# Patient Record
Sex: Male | Born: 1960 | Race: White | Hispanic: No | Marital: Married
Health system: Midwestern US, Community
[De-identification: ages and names within clinical notes are randomized; demographics above are authoritative.]

## PROBLEM LIST (undated history)

## (undated) DIAGNOSIS — R972 Elevated prostate specific antigen [PSA]: Secondary | ICD-10-CM

## (undated) DIAGNOSIS — C61 Malignant neoplasm of prostate: Principal | ICD-10-CM

---

## 2013-11-05 LAB — TYPE AND SCREEN
Antibody Screen: NEGATIVE
Rh Type: POSITIVE

## 2013-11-05 LAB — PROTIME/INR & PTT
INR: 1.1 (ref 0.9–1.1)
Protime: 11.1 s (ref 9.0–12.0)
aPTT: 25.5 s (ref 20.0–30.5)

## 2013-11-10 ENCOUNTER — Ambulatory Visit: Admit: 2013-11-10 | Discharge: 2013-11-10 | Disposition: A | Discharge status: Home / Self Care

## 2013-11-11 NOTE — Op Note (Signed)
PATIENTNYQUAN, SELBE         SURGERY DATE:         11/09/2013  MEDICAL RECORD NUMBER:     [id number]            ADMISSION DATE:       11/09/2013  ACCOUNT #:           [id number]           ADMITTING:            Trey Sailors, MD  DATE OF BIRTH:       1960/10/12             SURGEON:              Trey Sailors, MD  AGE:                 53                     HOSPITAL SERVICE:                                  Electronically Authenticated                            Trey Sailors, MD 11/18/2013 02:04 P        Procedures:  1. ROBOTIC ASSISTED LAPAROSCOPIC RADICAL PROSTATECTOMY.  2. BILATERAL PELVIC LYMPH NODE DISSECTION.  3. NERVE-SPARING.    Preoperative Diagnosis:  Adenocarcinoma of the prostate.    Postoperative Diagnosis:  Adenocarcinoma of the prostate.    Anesthesia:  General anesthesia.    Complications:  None.    Specimen Removed:  Prostate seminal vesicles and bilateral pelvic  lymph nodes.    Estimated Blood Loss:  250 mL.    Fluids:  Crystalloid.    Drains:  None.    Foley:  20-French 20 mL catheter.    Medications:  Ancef 2 g IV piggyback.    Assistant:  1. Dr. Rory Percy.  2. Dr. Merlyn Albert.    Indications:  This is a 53 year old gentleman who was diagnosed with  prostatic adenocarcinoma.  He did have a long and thorough discussion  regarding his risks, benefits, and treatment alternatives as well as  side effects, complications, and expected postoperative course.  After  having these explained to him, he does wish to proceed forward with  robotic assisted laparoscopic radical prostatectomy.    Description of Procedure:  The patient was brought to the operating  room.  A thorough time-out was performed.  Everybody present was in  agreement.  He was placed on the operating table.  General anesthesia  was induced per Anesthesia.  All airways and lines were maintained per  Anesthesia throughout the main indicate.  He was placed in the dorsal  lithotomy position.  All pressure points were padded.   He was secured  to the table using foam and tape.  He was placed in a Trendelenburg  position.  Due to the patient's very large body habitus, anesthesia  was having some difficulty ventilating the patient in a steep  Trendelenburg position, so Trendelenburg was taken out of the  patient's position to a lesser angle to help with his ambulation.  A  Foley catheter was inserted sterilely on the field  after the patient  was prepped and draped in usual sterile fashion.  Access to the  abdominal cavity was obtained by a Veress needle through a 2 cm  incision just cranial to the umbilicus.  The abdomen was insufflated  to 20 mmHg for port placement, later decreased to 15 mmHg for the  working portion of the case.  The camera port was placed and the  abdomen was inspected and visualized to have no injury from Veress  needle placement.  Following this, the following trocars were placed  under direct visualization.  Two 8 mm robotic ports were placed in the  patient's left side of midline.  Additional 8 mm robotic port was  placed on the patient's right side of midline.  A 15 mm assistant port  was placed in the patient's right side as well as 5 mm assistant port  in the patient's right side.  After this, the robotic cart was docked.  The robotic portion of the case was begun.  There was several small  bowel and large bowel adhesions, which were taken down off the pelvic  sidewall.  Due to the left in Trendelenburg position, needed to  maintain adequate ventilation as a very large patient.  Small bowel  could not be pulled out of the pelvis, so was adequately retracted by  the surgical bedside assistant.  The bladder was dropped off the  anterior abdominal wall by dissecting lateral to the medial umbilical  ligament and space of Retzius.  This was cleared down through the  pubic bone to develop this space of Retzius and expose the prostate  and endopelvic fascia.  The superior dorsal vein was controlled using  bipolar cautery  and the prostate was cleared of its fat.  Following  this, the pelvic lymph node dissection was begun on the left side.  The internal and external iliac lymph node packets were dissected out  and cautery was used to control hemostasis and small lymphatic  vessels.  The internal iliac packet consisted of the tissue ring near  the course of the internal iliac vessels with the pelvic side wall of  the lateral border of the bladder and endopelvic fascia to the medial  border working deep in the crotch towards the anterior iliac vessels.  The external iliac pack was cleared out border by the pelvic sidewall  and the external iliac vein.  The obturator nerve was identified and  care was taken to preserve and not cause any injury to the obturator  nerve.  Circumflex branches of the external iliac were preserved  intact and hemostasis was excellent after the lymph node dissection in  a similar process was carried out on the patient's contralateral side.  At this point, the endopelvic fascia was divided bilaterally sweeping  any nerve tissue off the prostate at this point in time.  The bladder  neck was tented.  The catheter was found to identify the junction  between the bladder neck and the prostate.  An 1 cm incision was  carried out through the bladder neck to the prostatic lumen.  The  Foley catheter was deflated, grasped, and retracted to provide upward  traction of the prostate.  The bladder neck was dissected off of the  prostate in the usual fashion.  Care was taken not to enter into the  plane of the prostate during this dissection.  The posterior plane was  developed after dividing the posterior detrusor muscle between the  bladder and the prostate.  Ureteral orifices were identified and found  to be well back from the bladder neck.  The posterior dissection was  continued until the ampulla of vas deferens were encountered, starting  on the patient's left side.  The ampulla of vas deferens was  transected.  The  left seminal vesicle was dissected out using minimal  electrocautery.  Hem-O-Lok were used to control the arterial supply.  The left vas deferens and the ampulla of the vas was then used to  provide upward retraction with the fourth arm and similar dissection  was carried on the patient's right side.  After the seminal vesicles  and vas deferens had been dissected out, the assistant did provide  upward retraction on the patient's right seminal vesicle and ampulla  of the vas, and posterior plane was developed bluntly.  This  dissection care was taken to avoid rectal injury.  Next, the prostatic  pedicles were developed and transected between Hem -O-Lok clips  starting the patient's left side.  The early bulky pedicle tissue was  taken between Hem-O-Lok clips.  After this, aggressive left-sided  nerve sparing was obtained in a retrograde fashion by sweeping off all  the vascular nerve tissue off the capsule of the prostate working  towards the prostatic apex.  Once at the level of the prostatic apex,  the loose areolar tissue was swept away and divided.  Our attention  was focused to the patient's right side, very aggressive nerve-sparing  was obtained on the patient's left side and standard nerve sparing was  obtained on the patient's right side.  On the right side, the bulky  pedicle tissue was taken between Weck clips to the level of the  prostate and following this, remaining [place] fatty tissue was swept  away from the capsule of the prostate working again towards the  prostatic apex.  At this point, attention was turned to the apical  dissection.  Pneumoperitoneum was increased to 20 mmHg for this  portion of the case and using a 0 degree lens and a hook, the apex of  the prostate and dorsal venous complex were divided down to the level  of Foley catheter.  The dorsal venous complex was then oversewn to  achieve excellent hemostasis.  The urethra was divided 5 mm away from  the apex of the prostate.  The  prostate was then placed in an  EndoCatch bag after grossly observing it and finding a free of any  evidence of tumor extension.  At this point in time, the  reconstruction was obtained and begun using 3-0 Hem-O-Lok starting at  the 4 o'clock position of the bladder neck and bites from the outside  in on the bladder, working inside out on the urethra in a clockwise  fashion around to 11 o'clock was begun to completely reapproximate the  urethra and bladder neck in a watertight fashion.  Midway between the  3 anastomosis, the direction of the suture was reversed, so that the  knot would lie on the outside of the bladder.  Using a second 3-0  V-Loc, 180 CV23 needle now working from outside in on the urethra and  inside on the bladder, starting again at around 4 o'clock on the  bladder neck, now working in a counter-clockwise fashion until the 2  stitches met again in a watertight fashion.  Running the stitches and  tied on the anterior surface of the bladder.  New Foley catheter was  placed and the bladder was filled with  250 mL of saline and found to  be watertight.  The decision was made not to leave a drain.  Hemostasis was excellent.  All stitches and chondroids were removed.  All instruments were removed.  The robot was undocked from the  patient's bedside.  The prostate was extracted through the midline  incision after extending the camera port slightly to allow for  adequate specimen extraction.  The fascia was closed using #1 PDS in a  figure-of-eight fashion.  Two of these figure-of-eights were  sufficient to close the extraction site.  These were then tied  together.  Wounds were copiously irrigated and the skin was closed  using 4-0 Monocryl.    Disk[employer] Job ID: [id number]        Trey Sailors, MD    DOD:11/11/2013 06:55 A  JBN/dsk  DOT:11/11/2013 08:47 A  Job Number: [id number]  Document Number: [id number]  cc:   Trey Sailors, MD        [place] [id number]

## 2013-12-18 LAB — PSA PROSTATIC SPECIFIC ANTIGEN: PSA: <0.01 nG/mL (ref 0.00–4.00)

## 2014-02-08 LAB — PSA PROSTATIC SPECIFIC ANTIGEN: PSA: <0.01 nG/mL (ref 0.00–4.00)

## 2014-05-27 LAB — BASIC METABOLIC PANEL
Anion Gap: 10 mmol/L (ref 6–16)
BUN/Creatinine Ratio: 11.4 RATIO (ref 6.0–20.0)
BUN: 12 mG/dL (ref 7–25)
CO2: 28 mmol/L (ref 21–31)
Calcium: 8.8 mG/dL (ref 8.2–10.5)
Chloride: 102 mmol/L (ref 98–109)
Creatinine: 1.05 mG/dL (ref 0.60–1.50)
GFR African American: >60 mL/min/{1.73_m2}
GFR Non-African American: >60 mL/min/{1.73_m2}
Glucose: 151 mG/dL — ABNORMAL HIGH (ref 70–100)
Potassium: 4.2 mmol/L (ref 3.5–5.0)
Sodium: 140 mmol/L (ref 135–145)

## 2014-05-27 LAB — PSA PROSTATIC SPECIFIC ANTIGEN: PSA: <0.01 nG/mL (ref 0.00–4.00)

## 2014-05-27 LAB — HEMOGLOBIN A1C: Hemoglobin A1C: 6.7 % — ABNORMAL HIGH (ref 4.2–5.6)

## 2014-05-27 LAB — TSH: TSH: 7 u[IU]/mL — ABNORMAL HIGH (ref 0.358–3.74)

## 2014-05-30 LAB — TESTOSTERONE FREE AND TOTAL MALE
Free Testosterone: 11 pg/mL — ABNORMAL LOW (ref 47–244)
Sex Hormone Binding: 24 nmol/L (ref 11–80)
Testosterone % Free: 2 % (ref 1.6–2.9)
Total Testosterone: 58 ng/dL — ABNORMAL LOW (ref 300–890)

## 2014-08-06 NOTE — Other (Unsigned)
Patient Acct Nbr: [id number]   Primary AUTH/CERT:   [place] Name: [place] of [place] name: [facility] SuperMed  Primary Insurance Group Number: [id number]  Primary Insurance Plan Type: Health  Primary Insurance Policy Number: [id number]

## 2014-12-03 LAB — PSA PROSTATIC SPECIFIC ANTIGEN: Prostatic Specific Ag: <0.01 ng/mL (ref <4.000)

## 2014-12-03 NOTE — Other (Unsigned)
Patient Acct Nbr: [id number]   Primary AUTH/CERT:   [place] Name: [place] of [place] name: [facility] SuperMed  Primary Insurance Group Number: [id number]  Primary Insurance Plan Type: Health  Primary Insurance Policy Number: [id number]

## 2015-04-19 ENCOUNTER — Encounter

## 2015-04-21 ENCOUNTER — Encounter: Attending: Urology | Primary: Family Medicine

## 2015-05-03 LAB — PSA PROSTATIC SPECIFIC ANTIGEN: Prostatic Specific Ag: <0.01 ng/mL (ref <4.000)

## 2015-05-03 NOTE — Other (Unsigned)
Patient Acct Nbr: [id number]   Primary AUTH/CERT:   [place] Name: [place] of [place] name: [facility] SuperMed  Primary Insurance Group Number: [id number]  Primary Insurance Plan Type: Health  Primary Insurance Policy Number: [id number]

## 2015-05-10 ENCOUNTER — Ambulatory Visit
Admit: 2015-05-10 | Discharge: 2015-05-10 | Payer: PRIVATE HEALTH INSURANCE | Attending: Urology | Primary: Family Medicine

## 2015-05-10 DIAGNOSIS — R300 Dysuria: Secondary | ICD-10-CM

## 2015-05-10 LAB — POCT URINALYSIS DIPSTICK W/O MICROSCOPE (AUTO)
Bilirubin, UA: NEGATIVE
Blood, UA POC: NEGATIVE
Glucose, UA POC: NEGATIVE
Ketones, UA: NEGATIVE
Leukocytes, UA: NEGATIVE
Nitrite, UA: NEGATIVE
Protein, UA POC: NEGATIVE
Spec Grav, UA: 1.03
Urobilinogen, UA: 0.2
pH, UA: 5.5

## 2015-05-10 NOTE — Progress Notes (Signed)
Lyn Hollingshead, MD        05/10/2015 at 10:17 AM  Office follow up          PATIENT NAME: Tyler Douglas   DATE OF BIRTH: 16-Oct-1960       TODAY'S DATE: 05/10/2015    CHIEF COMPLAINT:  Chief Complaint   Patient presents with   . Follow-up     follow up/ PSA prior       Subjective:   Tyler Douglas is a 56 y.o. male who presents to the office for follow up of prostate cancer, he underwent robotic assisted laparoscopic radical prostatectomy and bilateral pelvic lymph node dissection in November 2015.    He has been doing well since this time.     His urinary continence is excellent. He denies any problems with stress incontinence.    Denies any erectile function post op.     Review of Systems   Constitutional: Negative for chills and fever.   HENT: Negative for congestion.    Eyes: Negative for blurred vision.   Respiratory: Negative for cough and wheezing.    Cardiovascular: Negative for chest pain.   Gastrointestinal: Negative for abdominal pain, constipation, nausea and vomiting.   Genitourinary: Negative for dysuria, frequency and hematuria.   Musculoskeletal: Negative for falls and joint pain.   Neurological: Negative for dizziness and weakness.   Endo/Heme/Allergies: Does not bruise/bleed easily.   Psychiatric/Behavioral: Negative for depression and memory loss.         Medications  Prior to Admission medications    Medication Sig Start Date End Date Taking? Authorizing Provider   HYDROmorphone (EXALGO) 16 MG T24A extended release tablet TAKE ONE TABLET BY MOUTH ONCE A DAY. 04/14/15  Yes Historical Provider, MD   levothyroxine (SYNTHROID) 50 MCG tablet TAKE ONE TABLET BY MOUTH EVERY DAY  TAKE 2 TABS ON SUNDAY 05/02/15  Yes Historical Provider, MD   DULoxetine (CYMBALTA) 60 MG extended release capsule Take 60 mg by mouth daily   Yes Historical Provider, MD   atorvastatin (LIPITOR) 20 MG tablet Take 20 mg by mouth daily   Yes Historical Provider, MD   meloxicam (MOBIC) 15 MG tablet Take 15 mg by mouth daily   Yes  Historical Provider, MD   oxyCODONE-acetaminophen (PERCOCET) 7.5-325 MG per tablet Take 1 tablet by mouth every 6 hours as needed for Pain .   Yes Historical Provider, MD   fenofibrate (TRICOR) 145 MG tablet Take 145 mg by mouth daily   Yes Historical Provider, MD   gabapentin (NEURONTIN) 600 MG tablet Take 600 mg by mouth daily    Historical Provider, MD   tiZANidine (ZANAFLEX) 4 MG tablet Take 4 mg by mouth daily    Historical Provider, MD        Vitals:  Visit Vitals   . Ht 6' (1.829 m)   . Wt 275 lb (124.7 kg)   . BMI 37.3 kg/m2       Physical Exam  General: alert, appears stated age and cooperative   Abdomen: soft and nondistended    Back: straight, CVA tenderness absent   GU: defer exam       Labs:  WBC No results found for: WBC  BMP   Lab Results   Component Value Date    NA 140 05/27/2014    K 4.2 05/27/2014    CL 102 05/27/2014    CO2 28 05/27/2014    BUN 12 05/27/2014    CREATININE 1.05 05/27/2014  GLUCOSE 151 05/27/2014    CALCIUM 8.8 05/27/2014      PSA   Lab Results   Component Value Date    PSA <0.010 05/03/2015    PSA <0.010 12/03/2014    PSA <0.010 05/27/2014     UA   Lab Results   Component Value Date    COLORU GOLD 05/10/2015    GLUCOSEU NEG 05/10/2015    KETUA NEG 05/10/2015    BILIRUBINUR NEG 05/10/2015             Impression/Plan  1. Prostate cancer ([place])  - PSA, Prostatic Specific Antigen; Future    2. Dysuria  - POCT Urinalysis No Micro (Auto)    3. Urinary tract infection, site not specified  - POCT Urinalysis No Micro (Auto)         PSA in 4 mo. Will be close to two years post op at that point and can likely just follow PSA q 6 mo at that time.    Return in about 4 months (around 09/09/2015).    Lyn Hollingshead, MD   05/10/15   10:17 AM

## 2015-08-01 ENCOUNTER — Inpatient Hospital Stay: Admit: 2015-08-01 | Attending: Neurological Surgery | Primary: Family Medicine

## 2015-08-01 NOTE — Other (Unsigned)
Patient Acct Nbr: [id number]   Primary AUTH/CERT: [id number]  [place] Name: [place] of [place] name: [facility] SuperMed  Primary Insurance Group Number: [id number]  Primary Insurance Plan Type: Health  Primary Insurance Policy Number: [id number]    Secondary AUTH/CERT:   [place] Name: [employer]  Secondary Insurance Plan name: Medicare A  Secondary Insurance Group Number:   Secondary Insurance Plan Type: [place] Insurance Policy Number: [id number]    Tertiary AUTH/CERT:   [place] Name: [employer] Plan name: Medicare B  Tertiary Insurance Group Number:   [employer] Insurance Plan Type: [place] Crape  [employer] Number: [id number]

## 2015-09-12 ENCOUNTER — Telehealth

## 2015-09-12 NOTE — Telephone Encounter (Signed)
Noticed there ar no PSA results yet for the patient for tomorrow;s appointment so I called the patient and lmo  About if he got his PSA drawn or not, if not he will need to reschedule also left that on vm as well. Patient called me back, forgot about PSA, I rescheduled his appointment and he will get his PSA drawn at [facility].

## 2015-09-12 NOTE — Telephone Encounter (Signed)
Order entered.

## 2015-09-12 NOTE — Telephone Encounter (Signed)
Can I please get an order for a PSA, thank you very much.

## 2015-09-13 ENCOUNTER — Encounter: Attending: Urology | Primary: Family Medicine

## 2015-09-27 ENCOUNTER — Inpatient Hospital Stay: Attending: Urology | Primary: Family Medicine

## 2015-09-27 DIAGNOSIS — C61 Malignant neoplasm of prostate: Secondary | ICD-10-CM

## 2015-09-27 LAB — PSA PROSTATIC SPECIFIC ANTIGEN: Prostatic Specific Ag: 0.736 ng/mL (ref <4.000)

## 2015-09-27 NOTE — Other (Unsigned)
Patient Acct Nbr: [id number]   Primary AUTH/CERT:   [place] Name: [place] of [place] name: [place] Number: [id number]  Primary Insurance Plan Type: Health  Primary Insurance Policy Number: [id number]    Secondary AUTH/CERT:   [place] Name: [employer]  Secondary Insurance Plan name: Medicare A  Secondary Insurance Group Number:   Secondary Insurance Plan Type: [place] Number: [id number]

## 2015-10-06 ENCOUNTER — Ambulatory Visit
Admit: 2015-10-06 | Discharge: 2015-10-06 | Payer: PRIVATE HEALTH INSURANCE | Attending: Urology | Primary: Family Medicine

## 2015-10-06 DIAGNOSIS — C61 Malignant neoplasm of prostate: Secondary | ICD-10-CM

## 2015-10-06 LAB — POCT URINALYSIS DIPSTICK W/O MICROSCOPE (AUTO)
Blood, UA POC: NEGATIVE
Glucose, UA POC: NEGATIVE
Leukocytes, UA: NEGATIVE
Nitrite, UA: NEGATIVE
Protein, UA POC: NEGATIVE
Spec Grav, UA: 1.02
Urobilinogen, UA: 1
pH, UA: 6

## 2015-10-06 NOTE — Progress Notes (Signed)
Lyn Hollingshead, MD        10/06/2015 at 3:09 PM  Office follow up          PATIENT NAME: Tyler Douglas   DATE OF BIRTH: 25-Jul-1960       TODAY'S DATE: 10/06/2015    CHIEF COMPLAINT:  Chief Complaint   Patient presents with   . Follow-up     6 month follow up/ PSA prior       Subjective:   Mr. Prall is a 55 y.o. male who presents to the office for follow up of prostate cancer, he underwent robotic assisted laparoscopic radical prostatectomy and bilateral pelvic lymph node dissection in September 2015.    He has been doing well since this time.     His urinary continence is excellent. He denies any problems with stress incontinence.    No new issues.    Denies any erectile function post op.     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    Review of Systems   Constitutional: Negative for chills and fever.   HENT: Negative for congestion.    Eyes: Negative for blurred vision.   Respiratory: Negative for cough and wheezing.    Cardiovascular: Negative for chest pain.   Gastrointestinal: Negative for abdominal pain, constipation, nausea and vomiting.   Genitourinary: Negative for dysuria, frequency and hematuria.   Musculoskeletal: Negative for falls and joint pain.   Neurological: Negative for dizziness and weakness.   Endo/Heme/Allergies: Does not bruise/bleed easily.   Psychiatric/Behavioral: Negative for depression and memory loss.         Medications  Prior to Admission medications    Medication Sig Start Date End Date Taking? Authorizing Provider   HYDROmorphone (EXALGO) 16 MG T24A extended release tablet TAKE ONE TABLET BY MOUTH ONCE A DAY. 04/14/15   Historical Provider, MD   levothyroxine (SYNTHROID) 50 MCG tablet TAKE ONE TABLET BY MOUTH EVERY DAY  TAKE 2 TABS ON SUNDAY 05/02/15   Historical Provider, MD   gabapentin (NEURONTIN) 600 MG tablet Take 600 mg by mouth daily    Historical Provider, MD   DULoxetine (CYMBALTA) 60 MG extended release capsule Take 60 mg by mouth  daily    Historical Provider, MD   atorvastatin (LIPITOR) 20 MG tablet Take 20 mg by mouth daily    Historical Provider, MD   meloxicam (MOBIC) 15 MG tablet Take 15 mg by mouth daily    Historical Provider, MD   oxyCODONE-acetaminophen (PERCOCET) 7.5-325 MG per tablet Take 1 tablet by mouth every 6 hours as needed for Pain .    Historical Provider, MD   fenofibrate (TRICOR) 145 MG tablet Take 145 mg by mouth daily    Historical Provider, MD   tiZANidine (ZANAFLEX) 4 MG tablet Take 4 mg by mouth daily    Historical Provider, MD        Vitals:  BP 137/73  Pulse 81    Physical Exam  General: alert, appears stated age and cooperative   Abdomen: soft and nondistended incisions clean dry intact   Back: straight, CVA tenderness absent   GU: defer exam       Labs:  WBC No results found for: WBC  BMP   Lab Results   Component Value Date    NA 140 05/27/2014    K 4.2 05/27/2014    CL 102 05/27/2014    CO2 28 05/27/2014    BUN 12 05/27/2014    CREATININE  1.05 05/27/2014    GLUCOSE 151 05/27/2014    CALCIUM 8.8 05/27/2014      PSA   Lab Results   Component Value Date    PSA 0.736 09/27/2015    PSA <0.010 05/03/2015    PSA <0.010 12/03/2014     UA   Lab Results   Component Value Date    COLORU GOLD 05/10/2015    GLUCOSEU NEG 05/10/2015    KETUA NEG 05/10/2015    BILIRUBINUR NEG 05/10/2015             Impression/Plan  1. Prostate cancer ([place])    2. Rising PSA following treatment for malignant neoplasm of prostate     PSA detectable on check for the first time. All previous values have been undetectable.      Will recheck PSA today. Call with results    If PSA remains elevated, will need 2 years of androgen suppression and external beam radiation.    Return in about 4 months (around 02/05/2016).    Lyn Hollingshead, MD   10/06/15   3:09 PM

## 2015-10-06 NOTE — Addendum Note (Signed)
Addended by: Lyn Hollingshead on: 10/06/2015 03:10 PM     Modules accepted: Orders

## 2015-10-06 NOTE — Addendum Note (Signed)
Addended by: Lemar Lofty on: 10/06/2015 03:12 PM     Modules accepted: Orders

## 2015-10-07 ENCOUNTER — Telehealth

## 2015-10-07 LAB — PSA PROSTATIC SPECIFIC ANTIGEN: Prostatic Specific Ag: <0.01 ng/mL (ref <4.000)

## 2015-10-07 NOTE — Telephone Encounter (Signed)
LMOM for pt to return call.

## 2015-10-07 NOTE — Telephone Encounter (Signed)
Please let him know the repeat psa came back undetectable, great news. I will see him back in 6 mo with new PSA

## 2015-10-10 NOTE — Telephone Encounter (Signed)
Pt notified of PSA results and to follow up in 6 months with a PSA prior pt voiced his understanding

## 2015-12-02 NOTE — Other (Unsigned)
Patient Acct Nbr: [id number]   Primary AUTH/CERT:   [place] Name: [place] of [place] name: [facility] SuperMed  Primary Insurance Group Number: [id number]  Primary Insurance Plan Type: Health  Primary Insurance Policy Number: [id number]

## 2016-02-08 ENCOUNTER — Inpatient Hospital Stay: Attending: Urology | Primary: Family Medicine

## 2016-02-08 DIAGNOSIS — C61 Malignant neoplasm of prostate: Secondary | ICD-10-CM

## 2016-02-08 LAB — PSA PROSTATIC SPECIFIC ANTIGEN: Prostatic Specific Ag: <0.01 ng/mL (ref <4.000)

## 2016-02-08 NOTE — Other (Unsigned)
Patient Acct Nbr: [id number]   Primary AUTH/CERT:   [place] Name: [place] of [place] name: [place] Number: [id number]  Primary Insurance Plan Type: Health  Primary Insurance Policy Number: [id number]    Secondary AUTH/CERT:   [place] Name: [employer]  Secondary Insurance Plan name: Medicare A  Secondary Insurance Group Number:   Secondary Insurance Plan Type: [place] Number: [id number]

## 2016-02-15 ENCOUNTER — Ambulatory Visit
Admit: 2016-02-15 | Discharge: 2016-02-15 | Payer: PRIVATE HEALTH INSURANCE | Attending: Urology | Primary: Family Medicine

## 2016-02-15 DIAGNOSIS — C61 Malignant neoplasm of prostate: Secondary | ICD-10-CM

## 2016-02-15 LAB — POCT URINALYSIS DIPSTICK W/O MICROSCOPE (AUTO)
Bilirubin, UA: NEGATIVE
Blood, UA POC: NEGATIVE
Glucose, UA POC: NEGATIVE
Ketones, UA: NEGATIVE
Leukocytes, UA: NEGATIVE
Nitrite, UA: NEGATIVE
Protein, UA POC: NEGATIVE
Spec Grav, UA: 1.015
Urobilinogen, UA: 1
pH, UA: 6.5

## 2016-02-15 NOTE — Progress Notes (Signed)
Lyn Hollingshead, MD        02/15/2016 at 2:04 PM  Office follow up          PATIENT NAME: Tyler Douglas   DATE OF BIRTH: 05/04/1960       TODAY'S DATE: 02/15/2016    CHIEF COMPLAINT:  Chief Complaint   Patient presents with   . Prostate Cancer       Subjective:   Tyler Douglas is a 56 y.o. male who presents to the office for follow up of prostate cancer, he underwent robotic assisted laparoscopic radical prostatectomy and bilateral pelvic lymph node dissection in September 2015.    He has been doing well since this time.     His urinary continence is excellent. He denies any problems with stress incontinence.    No new issues.     Detectable PSA from August confirmed to be a lab error.     Denies any erectile function post op.     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    Review of Systems   Constitutional: Negative for chills and fever.   HENT: Negative for congestion.    Eyes: Negative for blurred vision.   Respiratory: Negative for cough and wheezing.    Cardiovascular: Negative for chest pain.   Gastrointestinal: Negative for abdominal pain, constipation, nausea and vomiting.   Genitourinary: Negative for dysuria, frequency and hematuria.   Musculoskeletal: Negative for falls and joint pain.   Neurological: Negative for dizziness and weakness.   Endo/Heme/Allergies: Does not bruise/bleed easily.   Psychiatric/Behavioral: Negative for depression and memory loss.         Medications  Prior to Admission medications    Medication Sig Start Date End Date Taking? Authorizing Provider   gabapentin (NEURONTIN) 100 MG capsule Take 100 mg by mouth daily . 02/08/16  Yes Historical Provider, MD   HYDROmorphone (EXALGO) 16 MG T24A extended release tablet TAKE ONE TABLET BY MOUTH ONCE A DAY. 04/14/15  Yes Historical Provider, MD   levothyroxine (SYNTHROID) 50 MCG tablet TAKE ONE TABLET BY MOUTH EVERY DAY  TAKE 2 TABS ON SUNDAY 05/02/15  Yes Historical Provider, MD   DULoxetine (CYMBALTA) 60  MG extended release capsule Take 60 mg by mouth daily   Yes Historical Provider, MD   atorvastatin (LIPITOR) 20 MG tablet Take 20 mg by mouth daily   Yes Historical Provider, MD   meloxicam (MOBIC) 15 MG tablet Take 15 mg by mouth daily   Yes Historical Provider, MD   oxyCODONE-acetaminophen (PERCOCET) 7.5-325 MG per tablet Take 1 tablet by mouth every 6 hours as needed for Pain .   Yes Historical Provider, MD   fenofibrate (TRICOR) 145 MG tablet Take 145 mg by mouth daily   Yes Historical Provider, MD   tiZANidine (ZANAFLEX) 4 MG tablet Take 4 mg by mouth daily as needed    Yes Historical Provider, MD        Vitals:  BP 136/70   Pulse 66   Ht 6' (1.829 m)   Wt 240 lb (108.9 kg)   BMI 32.55 kg/m     Physical Exam  General: alert, appears stated age and cooperative   Abdomen: soft and nondistended incisions clean dry intact   Back: straight, CVA tenderness absent   GU: defer exam       Labs:  WBC No results found for: WBC  BMP   Lab Results   Component Value Date    NA  140 05/27/2014    K 4.2 05/27/2014    CL 102 05/27/2014    CO2 28 05/27/2014    BUN 12 05/27/2014    CREATININE 1.05 05/27/2014    GLUCOSE 151 05/27/2014    CALCIUM 8.8 05/27/2014      PSA   Lab Results   Component Value Date    PSA <0.010 02/08/2016    PSA <0.010 10/06/2015    PSA 0.736 09/27/2015     UA   Lab Results   Component Value Date    COLORU yellow 10/06/2015    GLUCOSEU neg 02/15/2016    KETUA neg 02/15/2016    BILIRUBINUR neg 02/15/2016             Impression/Plan  1. Malignant neoplasm of prostate (HCC)  - POCT Urinalysis No Micro (Auto)  - PSA, Prostatic Specific Antigen; Future    2. Erectile dysfunction following radical prostatectomy       Repeat PSA from August came back undetectable confirming lab error.    He is doing great, 2 years and 4 mo post op.    Will check PSA every 6 mo until 5 yrs.      Return in about 6 months (around 08/14/2016).    Lyn Hollingshead, MD   02/15/16   2:04 PM

## 2016-08-06 ENCOUNTER — Inpatient Hospital Stay: Attending: Urology | Primary: Family Medicine

## 2016-08-06 DIAGNOSIS — C61 Malignant neoplasm of prostate: Secondary | ICD-10-CM

## 2016-08-06 LAB — PSA PROSTATIC SPECIFIC ANTIGEN: Prostatic Specific Ag: <0.064 ng/mL (ref <4.000)

## 2016-08-06 NOTE — Other (Unsigned)
Patient Acct Nbr: [id number]   Primary AUTH/CERT:   [place] Name: [place] of [place] name: [facility] [employer]  Primary Insurance Group Number: [id number]  Primary Insurance Plan Type: Health  Primary Insurance Policy Number: [id number]

## 2016-08-16 ENCOUNTER — Ambulatory Visit
Admit: 2016-08-16 | Discharge: 2016-08-16 | Payer: PRIVATE HEALTH INSURANCE | Attending: Urology | Primary: Family Medicine

## 2016-08-16 DIAGNOSIS — C61 Malignant neoplasm of prostate: Secondary | ICD-10-CM

## 2016-08-16 NOTE — Progress Notes (Signed)
Lyn Hollingshead, MD        08/16/2016 at 4:25 PM  Office follow up          PATIENT NAME: Tyler Douglas   DATE OF BIRTH: 11-Jun-1960       TODAY'S DATE: 08/16/2016    CHIEF COMPLAINT:  Chief Complaint   Patient presents with   . Follow-up     PSA prior   . Prostate Cancer       Subjective:   Tyler Douglas is a 56 y.o. male who presents to the office for follow up of prostate cancer, he underwent robotic assisted laparoscopic radical prostatectomy and bilateral pelvic lymph node dissection in September 2015.    He has been doing well since this time.     His urinary continence is excellent. He denies any problems with stress incontinence.    Denies erectile function.      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    Review of Systems   Constitutional: Negative for chills and fever.   HENT: Negative for congestion.    Eyes: Negative for blurred vision.   Respiratory: Negative for cough and wheezing.    Cardiovascular: Negative for chest pain.   Gastrointestinal: Negative for abdominal pain, constipation, nausea and vomiting.   Genitourinary: Negative for dysuria, frequency and hematuria.   Musculoskeletal: Negative for falls and joint pain.   Neurological: Negative for dizziness and weakness.   Endo/Heme/Allergies: Does not bruise/bleed easily.   Psychiatric/Behavioral: Negative for depression and memory loss.         Medications  Prior to Admission medications    Medication Sig Start Date End Date Taking? Authorizing Provider   gabapentin (NEURONTIN) 100 MG capsule Take 100 mg by mouth daily . 02/08/16  Yes Historical Provider, MD   HYDROmorphone (EXALGO) 16 MG T24A extended release tablet TAKE ONE TABLET BY MOUTH ONCE A DAY. 04/14/15  Yes Historical Provider, MD   levothyroxine (SYNTHROID) 50 MCG tablet TAKE ONE TABLET BY MOUTH EVERY DAY  TAKE 2 TABS ON SUNDAY 05/02/15  Yes Historical Provider, MD   DULoxetine (CYMBALTA) 60 MG extended release capsule Take 60 mg by mouth daily   Yes  Historical Provider, MD   atorvastatin (LIPITOR) 20 MG tablet Take 20 mg by mouth daily   Yes Historical Provider, MD   meloxicam (MOBIC) 15 MG tablet Take 15 mg by mouth daily   Yes Historical Provider, MD   oxyCODONE-acetaminophen (PERCOCET) 7.5-325 MG per tablet Take 1 tablet by mouth every 6 hours as needed for Pain .   Yes Historical Provider, MD   fenofibrate (TRICOR) 145 MG tablet Take 145 mg by mouth daily   Yes Historical Provider, MD   tiZANidine (ZANAFLEX) 4 MG tablet Take 4 mg by mouth daily as needed    Yes Historical Provider, MD        Vitals:  Ht 6' (1.829 m)   Wt 245 lb (111.1 kg)   BMI 33.23 kg/m     Physical Exam  General: alert, appears stated age and cooperative   Abdomen: soft and nondistended incisions clean dry intact   Back: straight, CVA tenderness absent   GU: defer exam       Labs:  WBC No results found for: WBC  BMP   Lab Results   Component Value Date    NA 140 05/27/2014    K 4.2 05/27/2014    CL 102 05/27/2014    CO2 28 05/27/2014  BUN 12 05/27/2014    CREATININE 1.05 05/27/2014    GLUCOSE 151 05/27/2014    CALCIUM 8.8 05/27/2014      PSA   Lab Results   Component Value Date    PSA <0.064 08/06/2016    PSA <0.010 02/08/2016    PSA <0.010 10/06/2015     UA   Lab Results   Component Value Date    COLORU yellow 10/06/2015    GLUCOSEU neg 02/15/2016    KETUA neg 02/15/2016    BILIRUBINUR neg 02/15/2016             Impression/Plan  1. Prostate cancer ([place])  - PSA, Prostatic Specific Antigen; Future    2. Erectile dysfunction following radical prostatectomy       He will be three years post op in September.    He is doing great    PSA undetectable    He will consider inflatable prosthesis vs intracavernosal injections.      Return in about 6 months (around 02/16/2017).    Lyn Hollingshead, MD   08/16/16   4:25 PM

## 2017-01-02 NOTE — Other (Unsigned)
Patient Acct Nbr: [id number]   Primary AUTH/CERT:   [place] Name: [place] name: [facility] [employer]  Primary Insurance Group Number: [id number]  Primary Insurance Plan Type: Health  Primary Insurance Policy Number: [id number]

## 2017-02-07 ENCOUNTER — Ambulatory Visit: Admit: 2017-02-07 | Primary: Family Medicine

## 2017-02-07 ENCOUNTER — Inpatient Hospital Stay: Admit: 2017-02-07 | Primary: Family Medicine

## 2017-02-07 NOTE — Other (Unsigned)
Patient Acct Nbr: [id number]   Primary AUTH/CERT: [id number]  [place] Name: [employer] of [place] name: [facility] [employer]  Primary Insurance Group Number: [id number]  Primary Insurance Plan Type: Health  Primary Insurance Policy Number: [id number]

## 2017-02-26 ENCOUNTER — Inpatient Hospital Stay: Admit: 2017-02-26 | Primary: Family Medicine

## 2017-02-26 ENCOUNTER — Ambulatory Visit: Primary: Family Medicine

## 2017-02-26 ENCOUNTER — Inpatient Hospital Stay: Attending: Urology | Primary: Family Medicine

## 2017-02-26 DIAGNOSIS — C61 Malignant neoplasm of prostate: Secondary | ICD-10-CM

## 2017-02-26 LAB — PSA PROSTATIC SPECIFIC ANTIGEN: Prostatic Specific Ag: <0.064 ng/mL (ref <4.000)

## 2017-02-26 NOTE — Other (Unsigned)
Patient Acct Nbr: [id number]   Primary AUTH/CERT: [id number]  [name] of [place] name: [name] Super[facility]  Primary Insurance Group Number: [id number]  Primary Insurance Plan Type: Health  Primary Insurance Policy Number: [id number]    Secondary AUTH/CERT:   [name] Name: [employer]  Secondary Insurance Plan name: Medicare A  Secondary Insurance Group Number:   Secondary Insurance Plan Type: [place] Number: [id number]

## 2017-02-26 NOTE — Other (Unsigned)
Patient Acct Nbr: [id number]   Primary AUTH/CERT:   [place] Name: [place] of [place] name: [facility] [employer]  Primary Insurance Group Number: [id number]  Primary Insurance Plan Type: Health  Primary Insurance Policy Number: [id number]

## 2017-02-28 ENCOUNTER — Ambulatory Visit
Admit: 2017-02-28 | Discharge: 2017-02-28 | Payer: PRIVATE HEALTH INSURANCE | Attending: Urology | Primary: Family Medicine

## 2017-02-28 DIAGNOSIS — C61 Malignant neoplasm of prostate: Secondary | ICD-10-CM

## 2017-02-28 NOTE — Progress Notes (Signed)
Lyn Hollingshead, MD        02/28/2017 at 3:18 PM  Office follow up          PATIENT NAME: Tyler Douglas   DATE OF BIRTH: [birth year]       TODAY'S DATE: 02/28/2017    CHIEF COMPLAINT:  Chief Complaint   Patient presents with   . Prostate Cancer     PSA results        Subjective:   Tyler Douglas is a 58 y.o. male who presents to the office for follow up of prostate cancer, he underwent robotic assisted laparoscopic radical prostatectomy and bilateral pelvic lymph node dissection in September 2015.    He has been doing well since this time.     His urinary continence is excellent. He denies any problems with stress incontinence.    Denies erectile function.      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    Review of Systems   Constitutional: Negative for chills and fever.   HENT: Negative for congestion.    Eyes: Negative for blurred vision.   Respiratory: Negative for cough and wheezing.    Cardiovascular: Negative for chest pain.   Gastrointestinal: Negative for abdominal pain, constipation, nausea and vomiting.   Genitourinary: Negative for dysuria, frequency and hematuria.   Musculoskeletal: Negative for falls and joint pain.   Neurological: Negative for dizziness and weakness.   Endo/Heme/Allergies: Does not bruise/bleed easily.   Psychiatric/Behavioral: Negative for depression and memory loss.         Medications  Prior to Admission medications    Medication Sig Start Date End Date Taking? Authorizing Provider   HYDROmorphone (EXALGO) 16 MG T24A extended release tablet TAKE ONE TABLET BY MOUTH ONCE A DAY. 04/14/15  Yes Historical Provider, MD   levothyroxine (SYNTHROID) 50 MCG tablet TAKE ONE TABLET BY MOUTH EVERY DAY  TAKE 2 TABS ON SUNDAY 05/02/15  Yes Historical Provider, MD   DULoxetine (CYMBALTA) 60 MG extended release capsule Take 60 mg by mouth daily   Yes Historical Provider, MD   atorvastatin (LIPITOR) 20 MG tablet Take 20 mg by mouth daily   Yes Historical Provider, MD    oxyCODONE-acetaminophen (PERCOCET) 7.5-325 MG per tablet Take 1 tablet by mouth every 6 hours as needed for Pain .   Yes Historical Provider, MD   fenofibrate (TRICOR) 145 MG tablet Take 145 mg by mouth daily   Yes Historical Provider, MD   tiZANidine (ZANAFLEX) 4 MG tablet Take 4 mg by mouth daily as needed    Yes Historical Provider, MD        Vitals:  Ht 6' (1.829 m)   Wt 250 lb (113.4 kg)   BMI 33.91 kg/m     Physical Exam  General: alert, appears stated age and cooperative   Abdomen: soft and nondistended incisions clean dry intact   Back: straight, CVA tenderness absent   GU: defer exam       Labs:  WBC No results found for: WBC  BMP   Lab Results   Component Value Date    NA 140 05/27/2014    K 4.2 05/27/2014    CL 102 05/27/2014    CO2 28 05/27/2014    BUN 12 05/27/2014    CREATININE 1.05 05/27/2014    GLUCOSE 151 05/27/2014    CALCIUM 8.8 05/27/2014      PSA   Lab Results   Component Value Date    PSA <  0.064 02/26/2017    PSA <0.064 08/06/2016    PSA <0.010 02/08/2016     UA   Lab Results   Component Value Date    COLORU yellow 10/06/2015    GLUCOSEU neg 02/15/2016    KETUA neg 02/15/2016    BILIRUBINUR neg 02/15/2016             Impression/Plan  1. Prostate cancer ([place])  - PSA, Prostatic Specific Antigen; Future       Nearly 3.5 years post op    He is doing great    PSA undetectable    He will consider inflatable prosthesis vs intracavernosal injections.      Return in about 6 months (around 08/28/2017).    Lyn Hollingshead, MD   02/28/17   3:18 PM

## 2017-02-28 NOTE — Patient Instructions (Signed)
Inflatable penile prosthesis versus intracavernosal penile injections.

## 2017-06-20 ENCOUNTER — Ambulatory Visit
Admit: 2017-06-20 | Discharge: 2017-06-20 | Payer: PRIVATE HEALTH INSURANCE | Attending: Orthopaedic Surgery of the Spine | Primary: Family Medicine

## 2017-06-20 ENCOUNTER — Encounter: Admit: 2017-06-20 | Discharge: 2017-06-20 | Payer: PRIVATE HEALTH INSURANCE | Primary: Family Medicine

## 2017-06-20 DIAGNOSIS — M542 Cervicalgia: Secondary | ICD-10-CM

## 2017-06-20 NOTE — Other (Unsigned)
Patient Acct Nbr: [id number]   Primary AUTH/CERT:   Primary Insurance Company Name: [employer]  Primary Insurance Plan name: [facility] [employer]Med  Primary Insurance Group Number: [id number]  Primary Insurance Plan Type: Health  Primary Insurance Policy Number: [id number]

## 2017-06-20 NOTE — Progress Notes (Signed)
[facility] [address] - [place]  [place]  [place]  [place] [id number]  Dept: [phone]  Loc: [place]  [birth year]  [id number]  06/20/2017      Problem List:    1. Cervical radiculopathy, bilateral C6 (right greater than left)  2. Cervical spondylosis  3. Multiple previous lumbar surgeries, L4/L5 fusion and L4-S1 decompression (Dr. Gatha Mayer)  4. Previous spinal cord stimulator and removal  5. Chronic narcotic dependence    Chief Complaint   Patient presents with   . Neck Pain     5-6/10   . Arm Pain     Right 6/10   . Back Pain     6-7/10   . Leg Pain     Right 3/10       HPI:  Tyler Douglas is a 57 y.o. male who is here today for evaluation of his neck and right arm pain.     Tyler Douglas is referred by Quenton Fetter, APRN - CNP.     Current symptoms: He describes a posterior neck pain and stiffness and a pain that radiates into right shoulder and arm. He states he occasional tingling and numbness in his right hand as well. He also reports an increase in headaches    Inciting Event/Trauma: No.      Duration of Symptoms: Worsening over the last 6 months    Associated arm pain: Right    Associated neurologic complaints:   Gait/balance difficulty: No   Use of ambulatory aid: No  Bowel/bladder dysfunction: No  Fine motor task difficulty: Yes with both hands    Aggravating factors:   1) Turning head  2) Reaching    Alleviating Factors:   1) Nothing    Previous Treatment:   1) PT: Yes- no improvement   2) NSAIDS: Advil- some improvement  3) Injections: Yes, last month- no improvement  4) Opiod medications: Hydromorphone and Percocet- some improvement  5) Other medications: Tizanidine- only takes prn  6) Pain management: Yes, sees Dr. Wynonia Musty    Quality of life/Tolerance of symptoms: Severely affecting quality of life  Work status: Not working  Smoking status: Former smoker  History of DVT/PE or hypercoagulable state (including history of relative): No but his brother has a history of  blood clots  Blood thinning medications: No    Has the patient had spine surgery and/or consulted with another spine surgeon?: Yes, had previous L4/5 fusion with Dr. Gatha Mayer in 2016. He also had a fusion at those same levels in 2005 with [facility] then the hardware was removed in 2006. He then had a spinal cord stimulator implanted in 2008 then removed in 2017.    Review of Systems   Constitutional: Negative for chills, diaphoresis and fever.   HENT: Negative for congestion and sore throat.    Eyes: Negative for visual disturbance.   Respiratory: Negative for cough and shortness of breath.    Cardiovascular: Negative for chest pain.   Gastrointestinal: Negative for abdominal pain.   Endocrine: Negative for cold intolerance.   Genitourinary: Negative for difficulty urinating.   Musculoskeletal: Negative for myalgias.   Skin: Negative for rash and wound.   Allergic/Immunologic: Negative for immunocompromised state.   Neurological: Negative for tremors.   Hematological: Negative for adenopathy. Does not bruise/bleed easily.   Psychiatric/Behavioral: Negative for dysphoric mood. The patient is not nervous/anxious.        No Known Allergies  Current Outpatient Medications   Medication Sig Dispense Refill   . buPROPion (WELLBUTRIN XL) 150 MG extended release tablet Take 150 mg by mouth every morning     . celecoxib (CELEBREX) 200 MG capsule Take 200 mg by mouth 2 times daily     . HYDROmorphone (EXALGO) 16 MG T24A extended release tablet TAKE ONE TABLET BY MOUTH ONCE A DAY.  0   . levothyroxine (SYNTHROID) 50 MCG tablet TAKE ONE TABLET BY MOUTH EVERY DAY  TAKE 2 TABS ON SUNDAY  11   . DULoxetine (CYMBALTA) 60 MG extended release capsule Take 60 mg by mouth daily     . atorvastatin (LIPITOR) 20 MG tablet Take 20 mg by mouth daily     . oxyCODONE-acetaminophen (PERCOCET) 7.5-325 MG per tablet Take 1 tablet by mouth every 6 hours as needed for Pain .     Marland Kitchen fenofibrate (TRICOR) 145 MG tablet Take 145 mg by mouth  daily     . tiZANidine (ZANAFLEX) 4 MG tablet Take 4 mg by mouth daily as needed        No current facility-administered medications for this visit.      Past Medical History:   Diagnosis Date   . Depression    . GERD (gastroesophageal reflux disease)    . Hyperlipidemia    . Osteoarthritis      Past Surgical History:   Procedure Laterality Date   . CERVICAL SPINE SURGERY  2008, 2005    x2   . PROSTATECTOMY  2015    radical   . SPINE SURGERY  04/20/2014   . TONSILLECTOMY AND ADENOIDECTOMY  2001     Social History     Socioeconomic History   . Marital status: Married     Spouse name: Not on file   . Number of children: Not on file   . Years of education: Not on file   . Highest education level: Not on file   Occupational History   . Not on file   Social Needs   . Financial resource strain: Not on file   . Food insecurity:     Worry: Not on file     Inability: Not on file   . Transportation needs:     Medical: Not on file     Non-medical: Not on file   Tobacco Use   . Smoking status: Former Smoker     Types: Cigarettes     Last attempt to quit: 05/09/2013     Years since quitting: 4.1   . Smokeless tobacco: Never Used   Substance and Sexual Activity   . Alcohol use: No   . Drug use: No   . Sexual activity: Not on file   Lifestyle   . Physical activity:     Days per week: Not on file     Minutes per session: Not on file   . Stress: Not on file   Relationships   . Social connections:     Talks on phone: Not on file     Gets together: Not on file     Attends religious service: Not on file     Active member of club or organization: Not on file     Attends meetings of clubs or organizations: Not on file     Relationship status: Not on file   . Intimate partner violence:     Fear of current or ex partner: Not on file     Emotionally abused: Not  on file     Physically abused: Not on file     Forced sexual activity: Not on file   Other Topics Concern   . Not on file   Social History Narrative   . Not on file     Family History    Problem Relation Age of Onset   . Cancer Mother    . Cancer Father    . Stroke Father    . Cancer Brother    . Diabetes Other         grandmother, grandfather         Physical Exam:    Vitals:    06/20/17 1032   BP: 134/88   Weight: 260 lb (117.9 kg)   Height: 6' (1.829 m)       GENERAL:  Patient is well developed and well nourished.The patient is in no acute distress.    NEUROLOGIC: Patient is awake, alert, and oriented to person, place, and time.  PSYCHIATRIC: Mood/affect is normal. Behavior is appropriate.  HEENT:  Head is normocephalic and atraumatic.  Extra-ocular movements are intact.  There is no evidence of conjunctivitis or icterus. No facial or neck deformities are visualized.    CHEST:  The patient has regular, unlabored respirations.  No apparent respiratory distress.  CARDIOVASCULAR:  The patient has palpable distal pulses. No symmetric lower extremity swelling is seen.    INTEGUMENTARY: The skin is warm and dry, without evidence of rashes, lesions or ulcers on all 4 extremities.  LYMPHATIC EXAM: There is no evidence of lymphadenopathy in the neck or extremities.     SPINE/EXTREMITY:    Posture: Posture is appropriate and within age defined normal limits. There is no abnormal kyphosis, lordosis or scoliosis. Forward gaze is maintained.    Gait: Non-antalgic and unassisted. Patient able to perform tandem gait. Patient able to walk on tip-toes and heels with some difficulty. Normal age-defined sit to stand time.     Inspection: No previous surgical scars noted within the cervical region.    Palpation:   Midline Paraspinal - Right Paraspinal - Left   Cervical + pain + pain + pain   Thoracic w/o pain w/o pain w/o pain   Lumbar w/o pain w/o pain w/o pain     Range of motion:   Flexion Extension Rotation Lateral Bend   Cervical Limited Limited Limited Limited   Lumbar Full - w/o pain Full - w/o pain Full - w/o pain Full - w/o pain     Upper Extremity Motor:   Del Bi Tri WE WF Int FF   Right 5 5 5 4 5 5 5     Left 5 5 5 4 5 5 5      Lower Extremity Motor:   HF Q TA EHL Peroneals GSC   Right 5 5 4 5 5 5    Left 5 5 5 5 5 5      Upper extremity sensation to light touch:   C5 C6 C7 C8 T1   Right Intact Diminished Intact Intact Intact   Left Intact Diminished Intact Intact Intact     Lower extremity sensation to light touch:   L2 L3 L4 L5 S1   Right Intact Intact Intact Intact Intact   Left Intact Intact Intact Intact Intact     Upper extremity reflexes:   Bicep Tricep Brachioradialis   Right 3+ 2+ 1+   Left 3+ 2+ 3+     Lower extremity reflexes:   Patellar Achilles   Right  2+ Absent   Left 2+ Absent     Misc:   Hoffman Spurling Clonus Babinski   Right Negative Negative None Absent   Left Negative Negative None Absent     Rhomberg Negative   Lhermitte's Sign Negative     Lower extremity pulses:   DP PT   Right 2+ 2+   Left 2+ 2+     Left upper extremity: Shoulder range of motion is full and symmetric without pain. There are no signs of shoulder impingement. Upon inspection, no evidence of asymmetry, masses or effusions. There is full range of motion, no evidence of instability and muscle strength is documented above.   Right upper extremity:  Shoulder range of motion is full and symmetric without pain. There are no signs of shoulder impingement. Upon inspection, no evidence of asymmetry, masses or effusions. There is full range of motion, no evidence of instability and muscle strength is documented above.   Right/Left lower extremity: Upon inspection, no evidence of asymmetry, masses or effusions. There is full range of motion, no evidence of instability and muscle strength is documented above.     Radiographic finding:    Radiographs:     Cervical Spine:   Date of Exam: 06/20/17  Views: 2 views of the cervical spine ( flexion and extension)   Findings: No abnormal pre-vertebral swelling. No obvious fracture or instability.  No congenital stenosis. Maintenance of normal cervical lordosis. Multi-level degenerative disc disease  noted.  Mild disc osteophyte complex at C5/C6.  No evidence of instability with flexion-extension.    Cervical MRI:  Date of exam: 02/26/17  Findings:   Minimal retrolisthesis of C5 on C6 is present. Otherwise, the cervical    vertebral bodies are in gross anatomic alignment. Probable mild    congenital narrowing of the central canal is present at the C5 and C6    levels.       There is no acute fracture. There is no focal marrow replacing    lesion. The cervical cord is grossly of normal caliber without signal    abnormality. There is no cerebellar tonsillar ectopia. The    paraspinal musculature is grossly unremarkable.        At the C2/C3 level, left-sided facet hypertrophy is present. Mild left    and no significant right-sided foraminal narrowing is noted.       At the C3/C4 level, mild right and no significant left-sided foraminal    narrowing is present. The central canal is patent.       At the C4/C5 level, the central canal is patent. Probable mild    congenital narrowing of the canal is present. The foramina are patent,    however slightly small in caliber.       At the C5/C6 level, broad-based posterior disc osteophyte complex    flattens the ventral cord. There is hypertrophy or buckling of the    ligamentum flavum. Severe narrowing of the central canal is present.    There is left-sided uncovertebral hypertrophy. Moderate to severe left    and mild/moderate right-sided foraminal narrowing is present.    Foraminal narrowing is likely in part congenital.       At the C6/C7 level, broad-based posterior disc herniation is present.    This contacts and mildly flattens the ventral cord. There is slight    buckling of hypertrophy of the ligamentum flavum. Moderate narrowing    of the central canal is present. Mild-to-moderate left and mild  right-sided foraminal narrowing is noted.       At the C7/T1 level, the central canal and foramina are patent.       At the T2/T3 level, central disc  herniation contacts and mildly    flattens the ventral cord.       Impression:    Degenerative changes with central canal stenosis worst at C5/C6.    Slightly lesser central canal stenosis is present at C6/C7.       Posterior disc herniation at T2/T3 contacts and mildly flattens the    ventral cord.      Thoracic MRI:  Date of exam: 02/26/17  Findings:   The thoracic vertebral bodies are in gross anatomic alignment. There    is no acute fracture. There are no focal marrow replacing lesions.    The thoracic cord is grossly of normal caliber without signal    abnormality.  The conus terminates at approximately the mid L1 level.    The paraspinal musculature is grossly unremarkable.        There is a small central disc herniation at T2/T3 which mildly    contacts the ventral cord.       Facet hypertrophy is present on the right at T4/T5. This mildly    impresses on the posterior thecal sac. There is mild right-sided    foraminal narrowing.       There are degenerative facet changes at T9/T10.       Impression:    Posterior disc herniation at T2/T3 mildly contacts the ventral cord.       Mild-to-moderate degenerative facet changes in the mid and lower    thoracic spine.       Other Diagnostic Testing:   EMG:   DEXA:     Lab Review:  No labs were reviewed/no labs were available for review this visit.    IMPRESSION:    See problem list above.    Tyler Douglas is a 57 y.o. male presenting with bilateral C6 radiculopathy in the setting of chronic narcotic dependence.    The patient has had a long-standing history of neck pain, but roughly new onset of right greater than left arm pain for last few months that is worsening in the C6 nerve root distribution.  He does have fairly severe stenosis on his magnetic resonance imaging, but is not showing clinical signs of myelopathy.    Since he has not had conservative treatment, think the best course of action moving forward is physical therapy coupled with over-the-counter  anti-inflammatories.  He would also like to pursue a right C6 selective nerve root block, and I think this is reasonable.  We discussed the risks and benefits of these interventions.    I will see the patient back in 2 months to discuss the efficacy of his conservative treatment.    I had a long discussion with Daaron to make sure he had a good understanding of what I think the main issues and diagnoses are that are affecting him today, and reviewed the plan going forward.    Smoking cessation counseling was provided to the patient today.  Several detrimental effects of long-term nicotine dependence were discussed, including but not limited to heart disease, lung disease, dental issues, decreased extremity blood flow and heightened risk of cancer.  In addition, patients who smoke with concurrent spinal conditions have been shown to have an increased risk of residual back and/or neck pain due to a decreased healing capacity.  Patients undergoing spine  surgery who continue to smoke have an increased risk of infection, decreased nerve healing potential/rate and a lower fusion rate--ultimately increasing their risk of potential revision surgery and/or a poor outcome. I reviewed their specific spine condition and how smoking is detrimental to their functional recovery for over 10 minutes.      PLAN:     Recommend smoking/vaping cessation   Physical therapy   Right C6 selective nerve root block with Dr. Wynonia Musty   Recommend weaning down narcotics   Follow-up in 2-3 months   Patient states he will call to schedule    No follow-ups on file.    Electronically signed by Gershon Mussel, MD    Dictated using Dragon Naturally Speaking Medical Version 2.4  Proof read however unrecognized voice recognition errors may have occurred

## 2017-09-04 ENCOUNTER — Encounter: Attending: Urology | Primary: Family Medicine

## 2018-02-14 NOTE — Telephone Encounter (Signed)
Spoke with patient.  Reviewed instructions and home medications for procedure.  Pt aware of arrival time at 0700 to SDS on 02/18/18, it is ok to have a light breakfast, take medications, and needs a driver after procedure.  Questions answered. Pt verbalized understanding    Directions given for Same Day Surgery.  Follow signs around the hospital to "Main Entrance" which is located at [address]  Turn onto [address]" from [facility].  You may use Valet Parking.  Each patient to receive one validation ticket for [employer].

## 2018-02-18 ENCOUNTER — Encounter: Admit: 2018-02-18 | Primary: Family Medicine

## 2018-02-18 ENCOUNTER — Ambulatory Visit: Admit: 2018-02-18 | Primary: Family Medicine

## 2018-02-18 LAB — PROTIME-INR
INR: 1.1 NA (ref 0.9–1.1)
Protime: 11.7 s (ref 9.0–12.0)

## 2018-02-18 LAB — PLATELET COUNT: Platelets: 232 10*3/uL (ref 140–440)

## 2018-02-18 MED ORDER — ACETAMINOPHEN 325 MG PO TABS
325 MG | ORAL | Status: DC | PRN
Start: 2018-02-18 — End: 2018-02-18

## 2018-02-18 MED ORDER — LIDOCAINE HCL (PF) 1 % IJ SOLN
1 % | Freq: Once | INTRAMUSCULAR | Status: AC | PRN
Start: 2018-02-18 — End: 2018-02-18
  Administered 2018-02-18: 15:00:00 5 via INTRADERMAL

## 2018-02-18 MED ORDER — SODIUM CHLORIDE 0.9 % IV SOLN
0.9 % | INTRAVENOUS | Status: DC
Start: 2018-02-18 — End: 2018-02-18

## 2018-02-18 MED ORDER — LIDOCAINE HCL 1 % IJ SOLN
1 | INTRAMUSCULAR | Status: AC
Start: 2018-02-18 — End: 2018-02-18

## 2018-02-18 MED ORDER — IOPAMIDOL 41 % IJ SOLN
41 % | Freq: Once | INTRAMUSCULAR | Status: AC | PRN
Start: 2018-02-18 — End: 2018-02-18
  Administered 2018-02-18: 15:00:00 15

## 2018-02-18 MED FILL — XYLOCAINE 1 % IJ SOLN: 1 % | INTRAMUSCULAR | Qty: 20

## 2018-02-18 NOTE — H&P (Signed)
[facility]  Comprehensive PreProcedure History and Physical      Name: Tyler Douglas MRN: [id number] DOB: [birth year]    (Age-58 y.o.)     Date of Service: Pt seen/examined on 02/18/2018     Chief Complaint:  58 y.o. male who we are asked to see/evaluate Tyler Douglas for pre-procedure evaluation prior to IVR CASE.    History Of Present Illness:      HPI:  Pt for myelogram. Severity: Severe Duration: > one week         Review of systems negative except for items below:  Past Medical History:   Diagnosis Date   ??? Depression    ??? GERD (gastroesophageal reflux disease)    ??? Hyperlipidemia    ??? Osteoarthritis      Patient Active Problem List    Diagnosis Date Noted   ??? Erectile dysfunction following radical prostatectomy 02/15/2016   ??? Rising PSA following treatment for malignant neoplasm of prostate 10/06/2015   ??? Prostate cancer ([place]) 05/10/2015   ??? Elevated prostate specific antigen (PSA) 04/19/2015   ??? Malignant neoplasm of prostate ([place]) 04/19/2015   ??? Dysuria 04/19/2015   ??? 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 Acquired cyst of kidney 04/19/2015         Past Surgical History:   Procedure Laterality Date   ??? CERVICAL SPINE SURGERY  2008, 2005    x2   ??? PROSTATECTOMY  2015    radical   ??? SPINE SURGERY  04/20/2014   ??? TONSILLECTOMY AND ADENOIDECTOMY  2001         Family History   Problem Relation Age of Onset   ??? Cancer Mother    ??? Cancer Father    ??? Stroke Father    ??? Cancer Brother    ??? Diabetes Other         grandmother, grandfather         Medications:   CURRENT MEDS W/ ASSOC DIAG     Med List Status:  Complete Set By: Nicanor Alcon, RN at 02/18/2018  7:42 AM            Start Date End Date     atorvastatin (LIPITOR) 20 MG tablet  --  --     Associated Diagnoses:   --     buPROPion (WELLBUTRIN XL) 150 MG extended release tablet  --  --     Associated Diagnoses:   --     celecoxib (CELEBREX) 200 MG capsule  --  --     Associated Diagnoses:   --     DULoxetine  (CYMBALTA) 60 MG extended release capsule  --  --     Associated Diagnoses:   --     fenofibrate (TRICOR) 145 MG tablet  --  --     Associated Diagnoses:   --     HYDROmorphone (EXALGO) 16 MG T24A extended release tablet  04/14/15  --     Associated Diagnoses:   --     levothyroxine (SYNTHROID) 50 MCG tablet  05/02/15  --     Associated Diagnoses:   --     oxyCODONE-acetaminophen (PERCOCET) 7.5-325 MG per tablet  --  --     Associated Diagnoses:   --     tiZANidine (ZANAFLEX) 4 MG tablet  --  --     Associated Diagnoses:   --          Social  history and Laboratory Data:    Lab Results   Component Value Date/Time    PLT 232 02/18/2018 07:48 AM    PROTIME 11.1 11/05/2013 12:29 PM    INR 1.1 11/05/2013 12:29 PM    APTT 25.5 11/05/2013 12:29 PM    NA 140 05/27/2014 01:10 PM    K 4.2 05/27/2014 01:10 PM    BUN 12 05/27/2014 01:10 PM    CREATININE 1.05 05/27/2014 01:10 PM    GLUCOSE 151 (H) 05/27/2014 01:10 PM    Social History     Tobacco Use   Smoking Status Former Smoker   ??? Types: Cigarettes   ??? Last attempt to quit: 05/09/2013   ??? Years since quitting: 4.7   Smokeless Tobacco Never Used      Social History     Substance and Sexual Activity   Alcohol Use No     Social History     Substance and Sexual Activity   Drug Use No          BP (!) 143/89    Pulse 72    Temp 98.5 ??F (36.9 ??C) (Temporal)    Resp 18    Ht 6' (1.829 m)    Wt 270 lb (122.5 kg)    SpO2 95%    BMI 36.62 kg/m??         Physical Examination:    Constitutional: No apparent distress, well nourished and in stable condition.    Cardiac: Regular rate and rhythm Abdomen: Soft and nonacute   Pulmonary: Clear bilaterally and no wheezing Neuro: Moves extremities X4 with no tremors   HEENT: No gross cranial nerve defects and anicteric sclera Skin: Skin warm and dry with no visible rashes   Vascular: Adequate perfusion of extremities with no cyanosis Psych: Alert and oriented x3 with appropriate affect   Lymphatics: No swelling of arms/hands with no pedal edema  Neck: FROM with no JVD   Additional Notes:None    After review of the medical history and physical assessment, medications, allergies, patient's current medical condition, and labs, this patient is at acceptable risk for the planned procedure at this facility.      Electronically signed by: Charyl Bigger, MD Date: 02/18/2018 at 8:07 AM

## 2018-02-18 NOTE — Discharge Instructions (Signed)
Recovery Instructions Myelogram  Activity:  . Rest for 24 hours  . Lie on a bed or couch with your head raised greater than 30 degrees (at least  2-3 pillows)  . Stay in bed most of the time. Only get up to use the bathroom or other  necessities  . Standing, sitting fully upright for extended periods of time, straining, bending at  the waist, lifting, excessive coughing or other strenuous activities in the first 24  hours following a myelogram can increase the risk of  a "spinal headache  syndrome"  . Do not drive for 24 hours following a myelogram  . You may slowly resume your normal activities after 24 hours    Food and Fluids:  . The contrast material (dye) will be absorbed into your bloodstream and  eliminated by your kidneys over the next 1-2 days  . Drink extra fluids today - 1 cup (8oz) every 2 hours until bedtime  . You may resume your normal diet as tolerated    Medications:  . You may resume your medications tomorrow    If you develop any of the following, call the Department of Radiology immediately:  . Excessive nausea and/or vomiting  . Severe headache or stiff neck  . Fever greater than 101 F  . Unexplained confusion  . Light sensitivity    If you have questions or problems following your procedure, please call   [facility] Procedure/Angio Department:   Monday - Friday 7:00am to 5pm. Call the RN voice mail, leave a message and the RN will return your call. ([phone])  After hours, weekends and holidays, call ([phone]) and ask for the Angiography Radiologist on-call.

## 2018-02-18 NOTE — Progress Notes (Signed)
Special procedures portion of myelogram completed pt tol well no new co pain. Pt will go to CT and then SDS post op for recovery period. Band aide applied to lumbar area.

## 2018-02-18 NOTE — Progress Notes (Signed)
Pt ambulated to the bathroom with stand by assist. Tolerated well. Pt was able to void without difficulty. Pts wife called for ride. Will call when she gets here. Pt has changed and will be discharged home with valuables via wheelchair.

## 2018-02-18 NOTE — Progress Notes (Signed)
Discharge instructions given to pt who verbalized understanding, disk brought to bedside. Pt denies questions at this time. Pt scheduled for DC at noon

## 2018-02-18 NOTE — Progress Notes (Signed)
Pt arrived awake a/ox3 pt spoke with dr Dorena Bodo - consent obtained. Pt placed in prone position in procedure room 19 pt prep and draped per protocol for myelogram.

## 2018-02-18 NOTE — Other (Unsigned)
Patient Acct Nbr: [id number]   Primary AUTH/CERT: [id number]  [place] Name: [place] name: [facility] [employer]  Primary Insurance Group Number: [id number]  Primary Insurance Plan Type: Health  Primary Insurance Policy Number: [id number]    Secondary AUTH/CERT:   [place] Name: [employer]  Secondary Insurance Plan name: Medicare A  Secondary Insurance Group Number:   Secondary Insurance Plan Type: [place] Number: [id number]

## 2018-02-18 NOTE — Brief Op Note (Signed)
Brief Postoperative Note    GILAD DUGGER  Date of Birth:  [birth year]  [id number]    Pre-operative Diagnosis: Backpain status post fusion    Post-operative Diagnosis: Same    Procedure: Lumbar myelogram    Anesthesia: Local    Surgeons/Assistants: Trapper Meech    Estimated Blood Loss: less than 50     Complications: None    Specimens: Was Not Obtained    Findings: Please see radiology report for full procedural details.       Electronically signed by Charlott Holler on 02/18/2018 at 10:06 AM

## 2019-09-29 IMAGING — MR MRI LEFT KNEE WITHOUT CONTRAST
5 of 6 series · 25 of 40 positions shown · IV contrast (gadolinium)
Comparison: None prior.

MRI LEFT KNEE WITHOUT CONTRAST, 09/29/2019 [DATE]: 
CLINICAL INDICATION: Pain in both knee joints. No injuries.
TECHNIQUE: Multiplanar, multiecho position MR images of the left knee were 
performed without intravenous gadolinium enhancement.

[Series 101: survey_fullfov_transversal · axial · 10.0mm · 1.04mm/px · 1 of 7 slices shown]
[im 1/7]
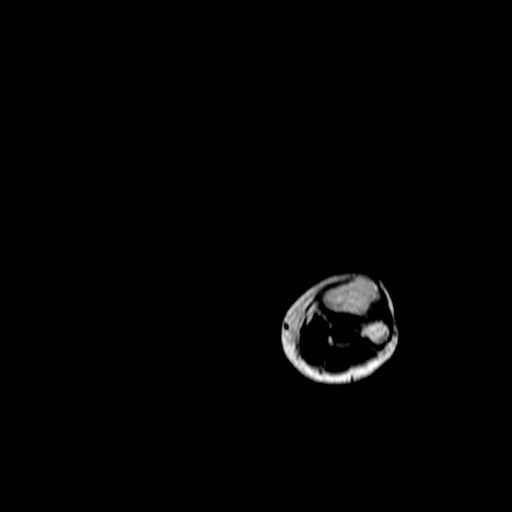

[Series 201: survey_ · axial · 10.0mm · 1.17mm/px · z∈[-20,+149]mm · 2 of 9 slices shown]
[im 1/9]
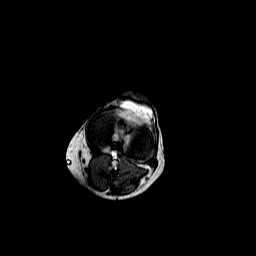
[im 9/9]
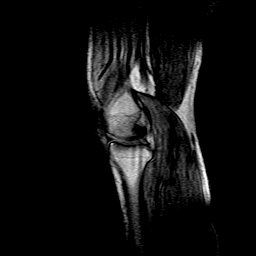

[Series 301: pdw spair sag · sagittal · 3.0mm · 0.50mm/px · 9 of 30 slices shown]
[im 1/30]
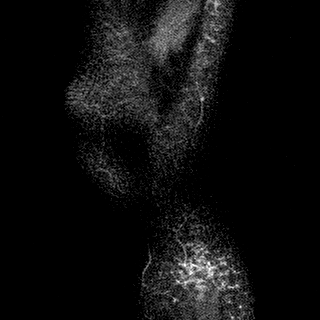
[im 4/30]
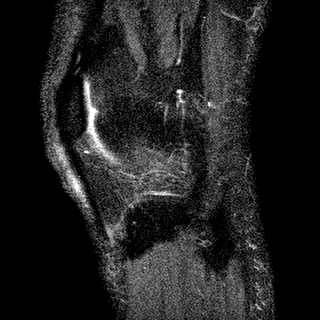
[im 8/30]
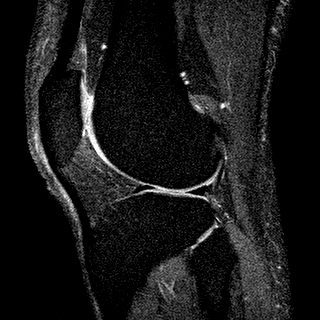
[im 11/30]
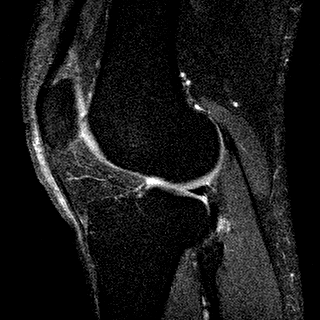
[im 15/30]
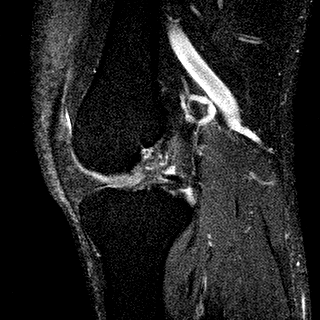
[im 19/30]
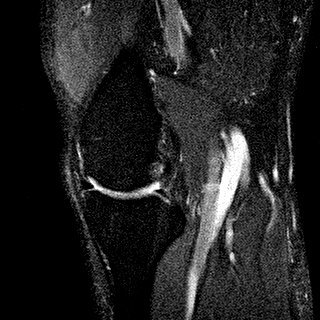
[im 22/30]
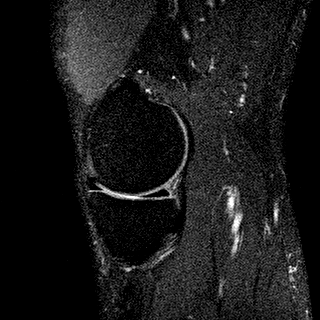
[im 26/30]
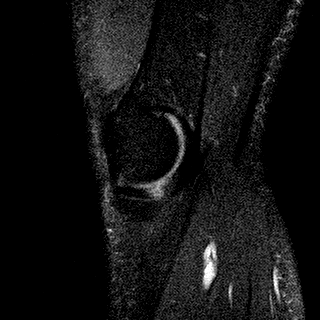
[im 30/30]
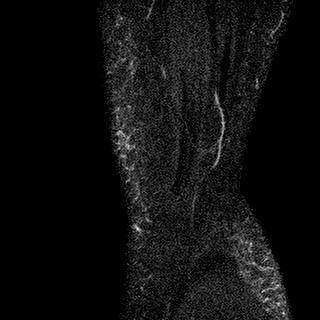

[Series 401: T1 · coronal · 3.0mm · 0.31mm/px · 9 of 30 slices shown]
[im 1/30]
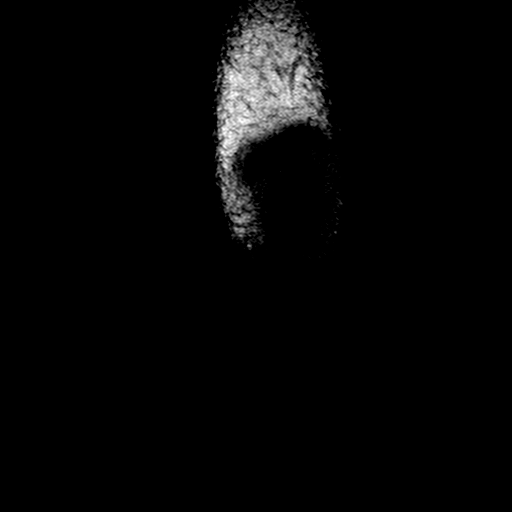
[im 4/30]
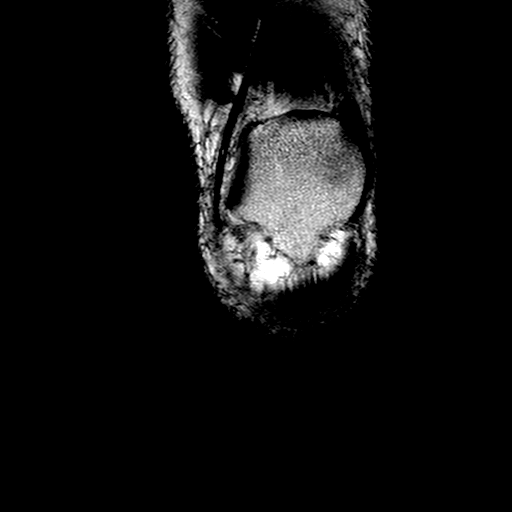
[im 8/30]
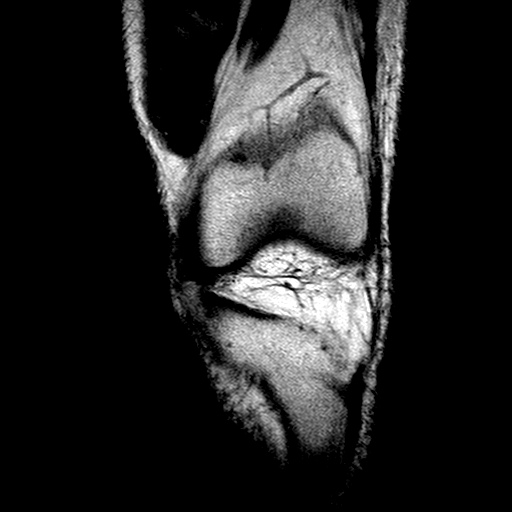
[im 11/30]
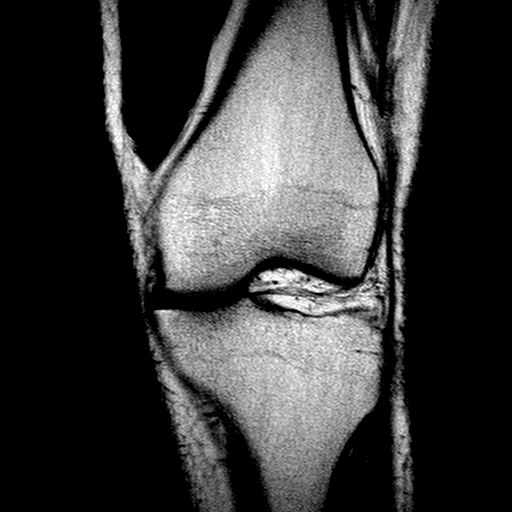
[im 15/30]
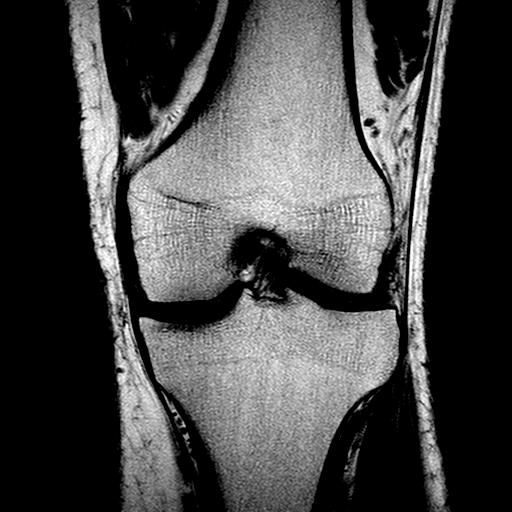
[im 19/30]
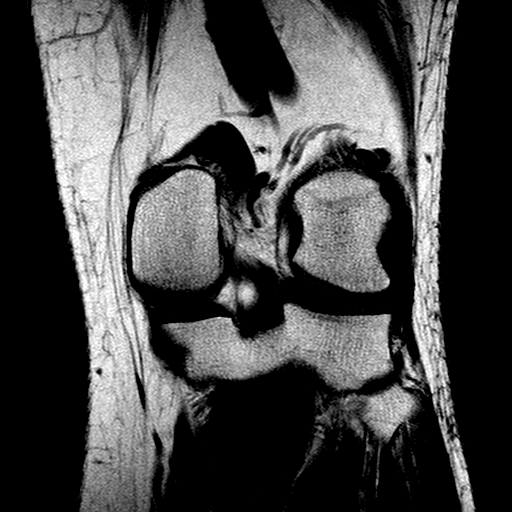
[im 22/30]
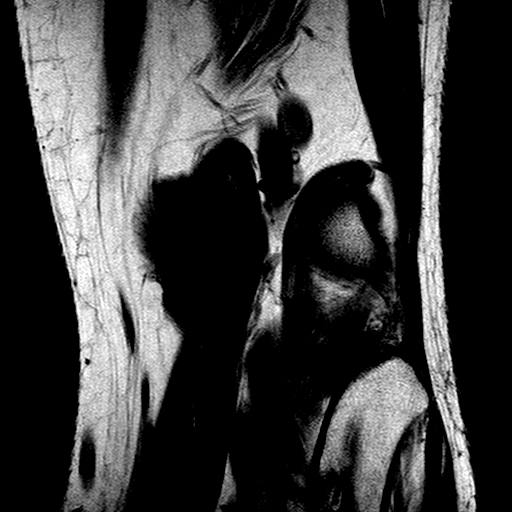
[im 26/30]
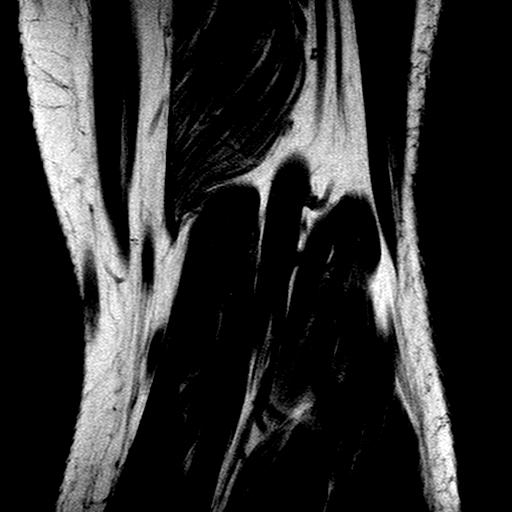
[im 30/30]
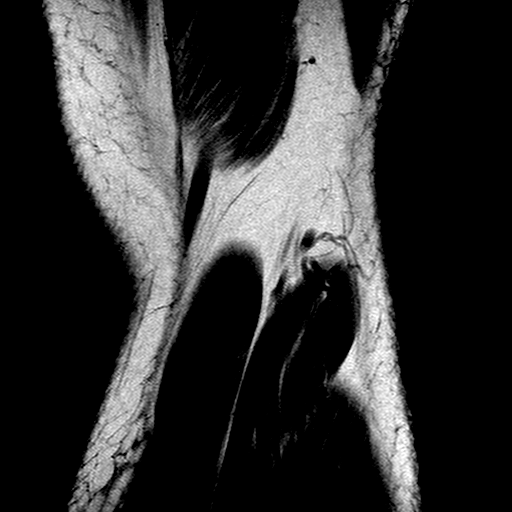

[Series 501: pdw spair cor · coronal · 3.0mm · 0.31mm/px · 4 of 30 slices shown]
[im 1/30]
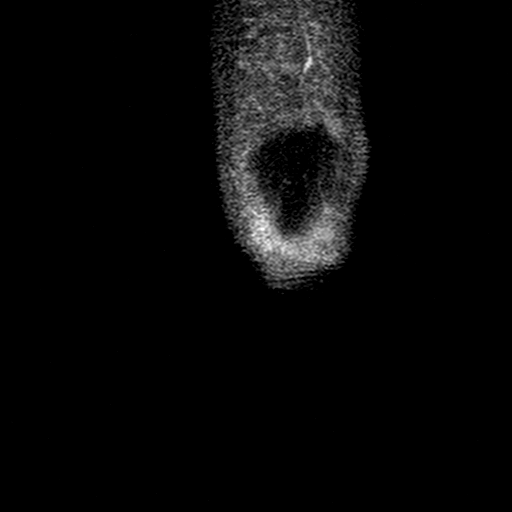
[im 4/30]
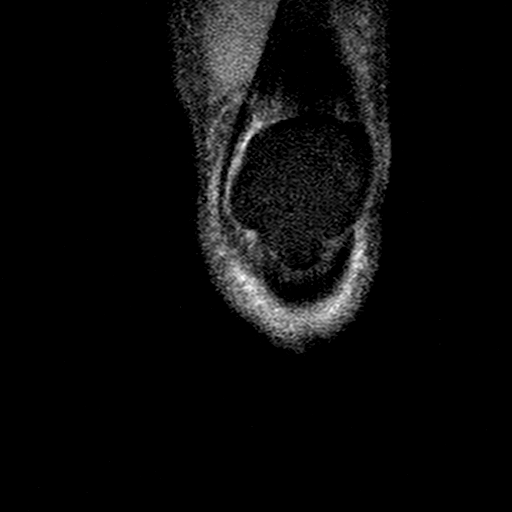
[im 8/30]
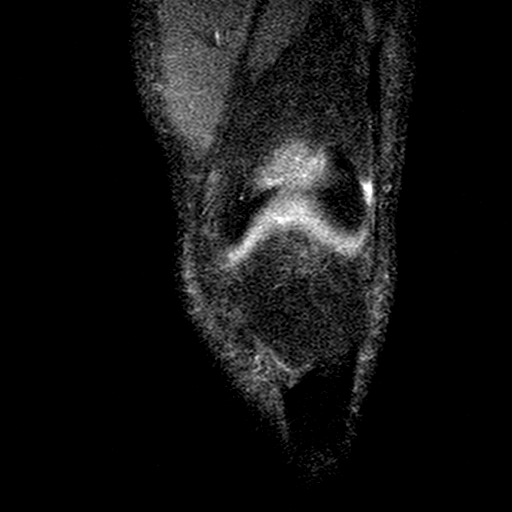
[im 11/30]
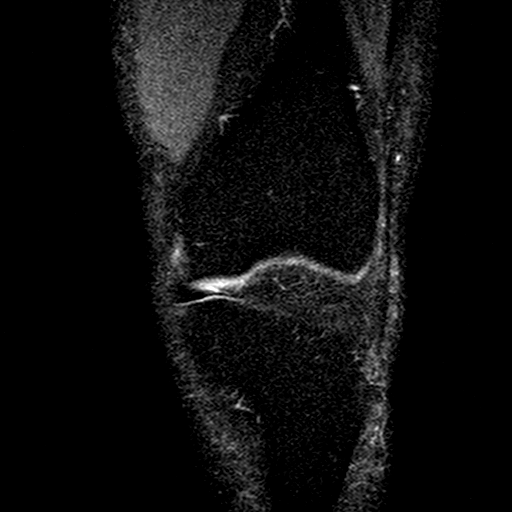

[25 of 40 positions shown; findings below may reference images not displayed]

FINDINGS: Left knee: 
MEDIAL COMPARTMENT: Slight fraying of the apical free margin of the medial 
meniscal body as seen for example on the coronal series, image 20 with partial 
peripheral extrusion. Mild articular cartilaginous loss, grade 2. 
LATERAL COMPARTMENT: The lateral meniscus is intact without tear or extrusion. 
Mild articular cartilaginous loss, grade 2. 
PATELLOFEMORAL COMPARTMENT: The patella is centrally located. Mild articular 
cartilaginous loss, grade 2.. 
LIGAMENTS: The anterior cruciate ligament is intact. The posterior cruciate 
ligament is intact. The medial collateral ligament and lateral collateral 
ligaments are preserved. 
EXTENSOR MECHANISM: The quadriceps and patellar tendon are preserved. The medial 
and lateral retinaculum are intact. 
POSTEROMEDIAL CORNER: The semimembranosus and pes anserine tendons are 
preserved. The posterior medial capsule is preserved. 
POSTEROLATERAL CORNER: The popliteal tendon and popliteofibular ligament are 
intact. The biceps femoris is negative. 
BONES AND SOFT TISSUES: Normal bone marrow signal intensity. No fracture or 
contusion. Mild spurring of the medial aspect of the fibular head Trace knee 
joint effusion. Small amount of fluid extends about the posterior lateral corner 
of the knee. No popliteal cyst. The musculature is symmetric without mass, 
signal abnormality or atrophy. Subcutaneous tissues are negative. Neurovascular 
bundles are negative.
IMPRESSION: 1.  Mild tricompartmental degenerative changes. 
2.  Mild fraying of the apical free margin of the medial meniscus with partial 
peripheral extrusion.

## 2019-09-29 IMAGING — MR MRI RIGHT KNEE WITHOUT CONTRAST
5 of 6 series · 25 of 40 positions shown · IV contrast (gadolinium)
Comparison: None prior.

MRI RIGHT KNEE WITHOUT CONTRAST, 09/29/2019 [DATE]: 
CLINICAL INDICATION: Pain in both knee joints. No injuries.
TECHNIQUE: Multiplanar, multiecho position MR images of the right knee were 
performed without intravenous gadolinium enhancement.

[Series 101: survey_fullfov_transversal · axial · 10.0mm · 1.04mm/px · z∈[-51,+51]mm · 3 of 7 slices shown]
[im 1/7]
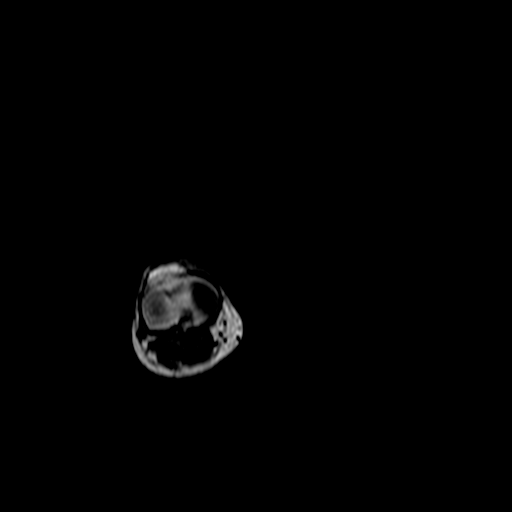
[im 4/7]
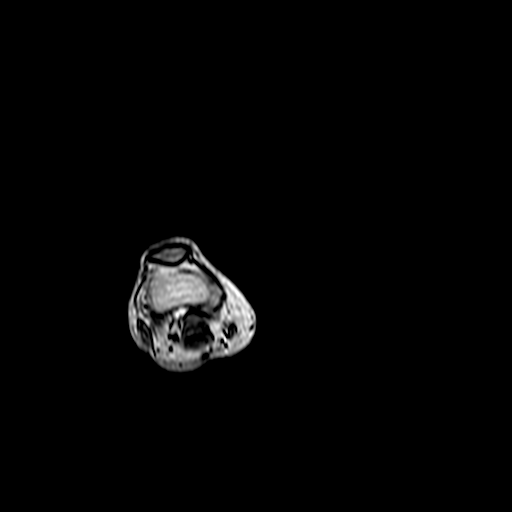
[im 7/7]
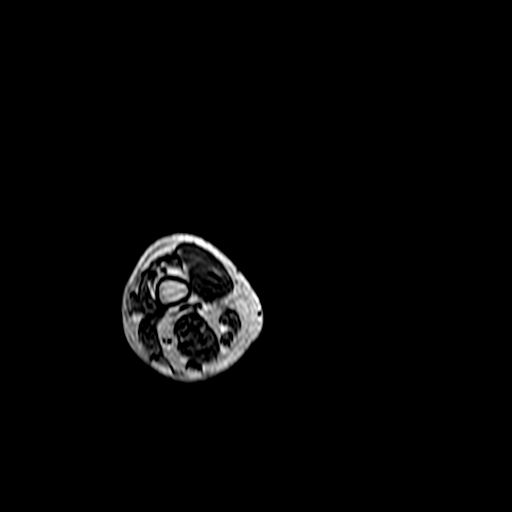

[Series 201: survey_ · axial · 10.0mm · 1.17mm/px · z∈[-20,+149]mm · 2 of 9 slices shown]
[im 1/9]
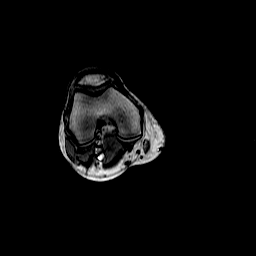
[im 9/9]
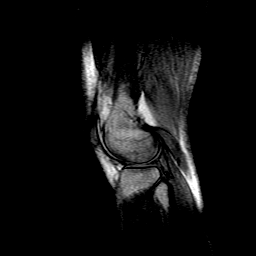

[Series 301: pdw spair sag · sagittal · 3.0mm · 0.50mm/px · 9 of 32 slices shown]
[im 1/32]
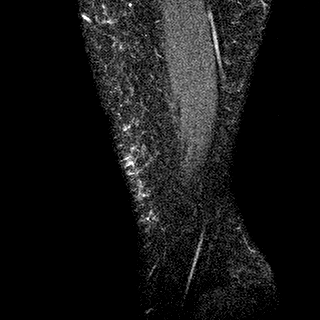
[im 4/32]
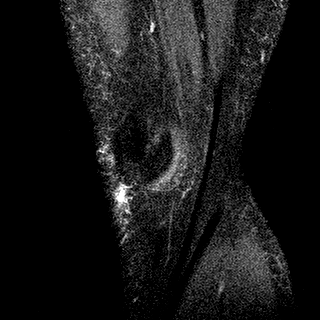
[im 8/32]
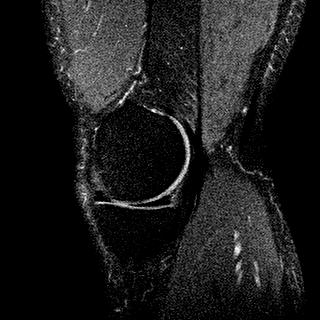
[im 12/32]
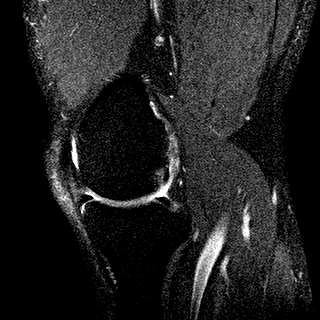
[im 16/32]
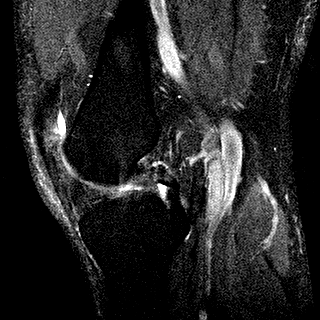
[im 20/32]
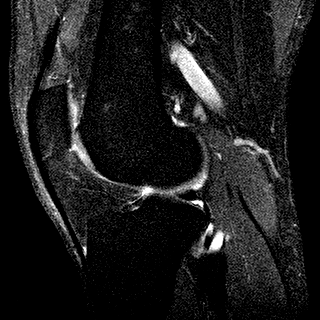
[im 24/32]
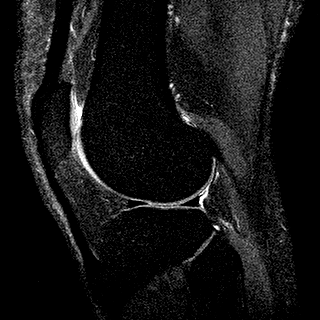
[im 28/32]
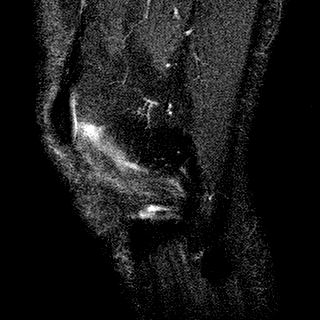
[im 32/32]
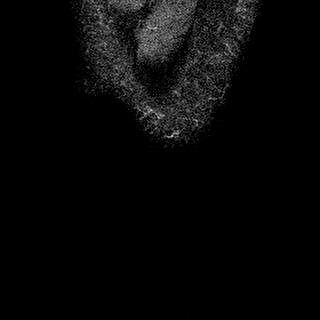

[Series 401: T1 · coronal · 3.0mm · 0.31mm/px · 8 of 30 slices shown]
[im 1/30]
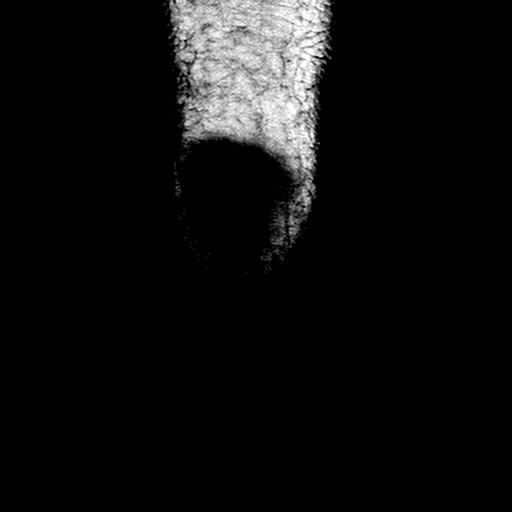
[im 5/30]
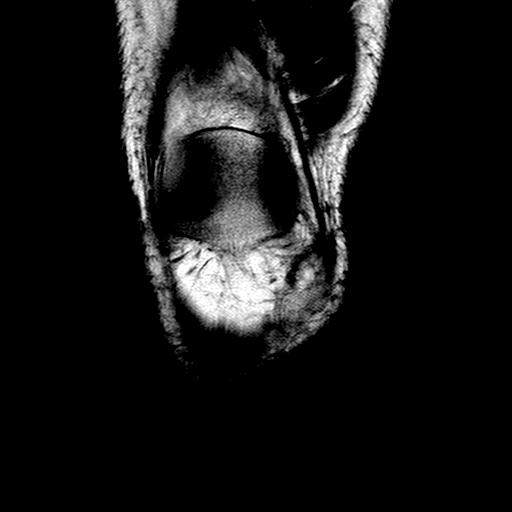
[im 9/30]
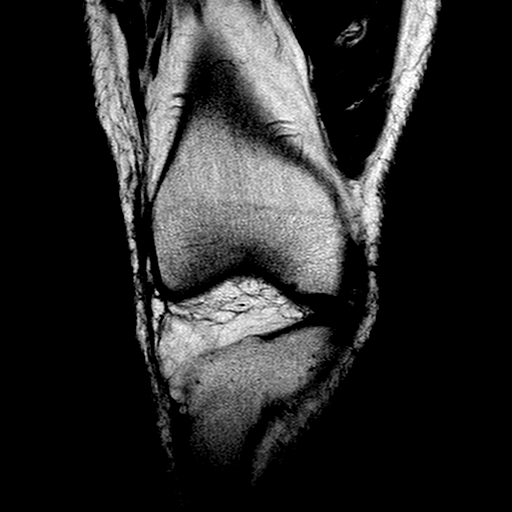
[im 13/30]
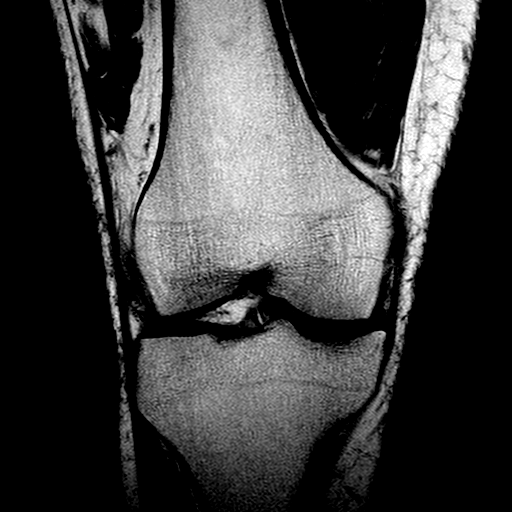
[im 17/30]
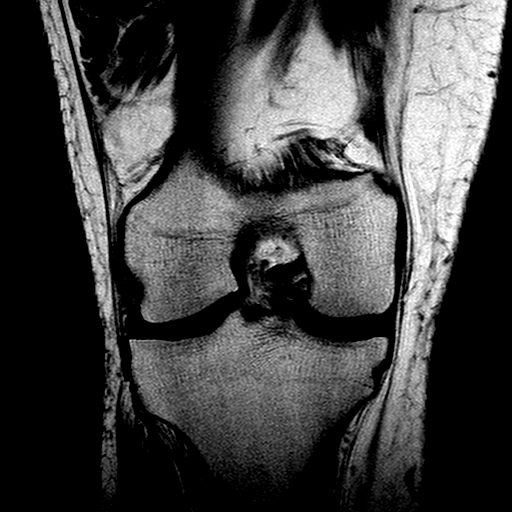
[im 21/30]
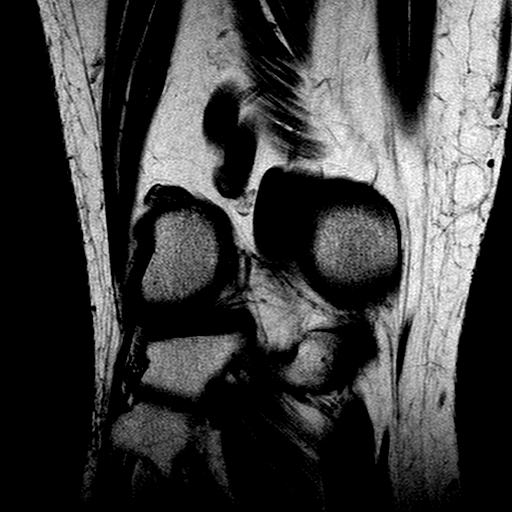
[im 25/30]
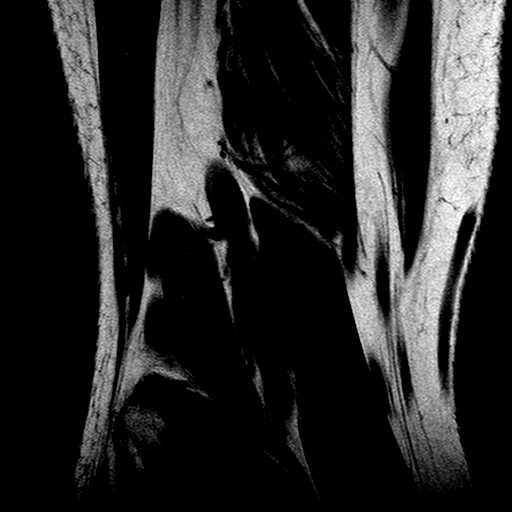
[im 30/30]
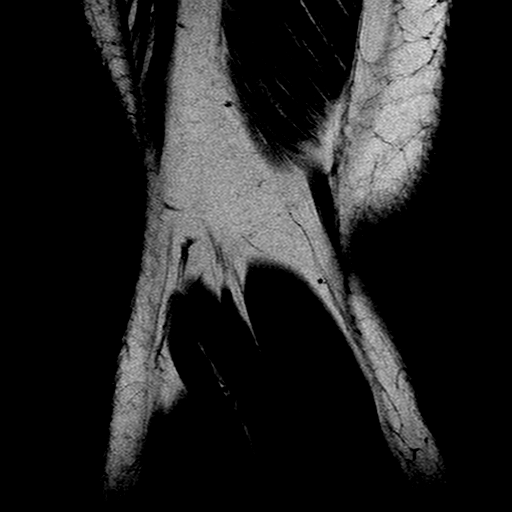

[Series 501: pdw spair cor · coronal · 3.0mm · 0.31mm/px · 3 of 30 slices shown]
[im 1/30]
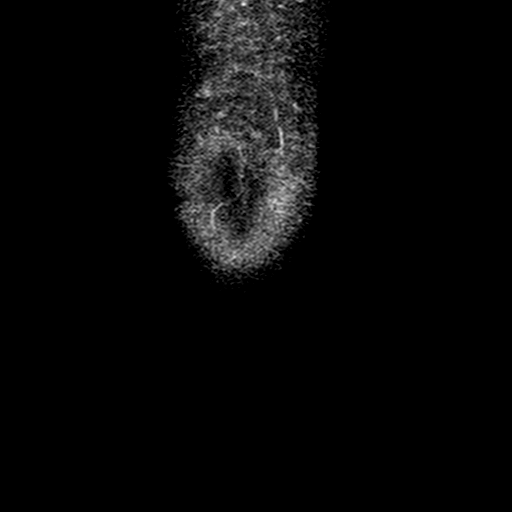
[im 5/30]
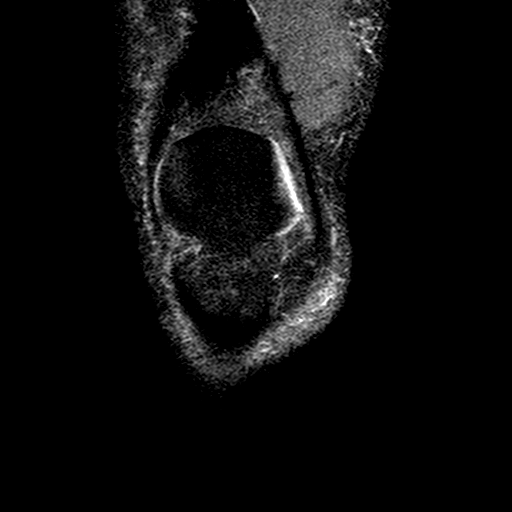
[im 9/30]
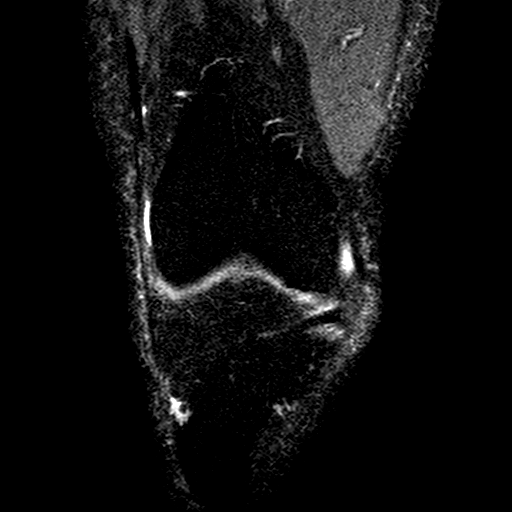

[25 of 40 positions shown; findings below may reference images not displayed]

FINDINGS: Right knee: 
MEDIAL COMPARTMENT: Mild abnormal intrasubstance degenerative type signal 
intensity within the body of the medial meniscus with partial peripheral 
meniscal extrusion. Mild articular cartilaginous loss, grade 2. 
LATERAL COMPARTMENT: The lateral meniscus is intact without tear or extrusion. 
Mild articular cartilaginous loss, grade 2. 
PATELLOFEMORAL COMPARTMENT: The patella is centrally located. Mild articular 
cartilaginous loss, grade 2. 
LIGAMENTS: The anterior cruciate ligament is intact. The posterior cruciate 
ligament is intact. The medial collateral ligament and lateral collateral 
ligaments are preserved. 
EXTENSOR MECHANISM: The quadriceps and patellar tendon are preserved. The medial 
and lateral retinaculum are intact. 
POSTEROMEDIAL CORNER: The semimembranosus and pes anserine tendons are 
preserved. The posterior medial capsule is preserved. 
POSTEROLATERAL CORNER: The popliteal tendon and popliteofibular ligament are 
intact. The biceps femoris is negative. 
BONES AND SOFT TISSUES: Normal bone marrow signal intensity. No fracture or 
contusion. Trace knee joint effusion. Fluid extends about the posterior lateral 
corner of the knee. No popliteal cyst. The musculature is symmetric without 
mass, signal abnormality or atrophy. Subcutaneous tissues are negative. 
Neurovascular bundles are negative.
IMPRESSION: 1.  Mild tricompartmental degenerative changes. 
2.  Mild medial meniscal degenerative signal intensity with partial peripheral 
extrusion.

## 2020-09-02 IMAGING — CT CT CALCIUM SCORING
1 series · 15 of 20 positions shown, 19 images · non-contrast
Comparison: None.

FINAL Diagnostic Imaging Report 
________________________________________________________________________________________________ 
CT CALCIUM SCORING, 09/02/2020 [DATE]:
INDICATION: Evaluate coronary artery calcification.

[Series 2: cascore 3.0 b35s 60% · axial · 0.50mm/px · z∈[-204,-89]mm · 15 of 87 slices shown, 19 images]
[im 5/87  vessel]
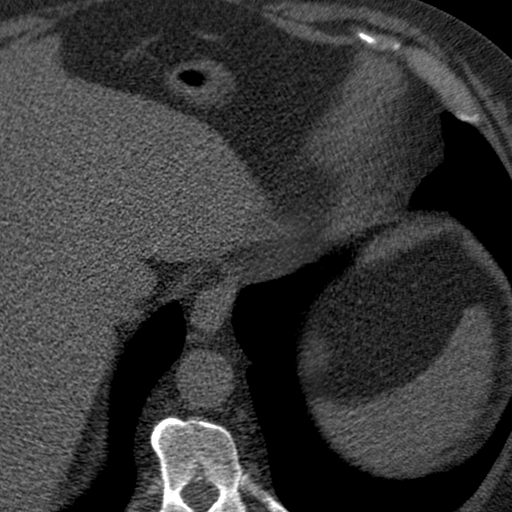
[im 5/87  lung]
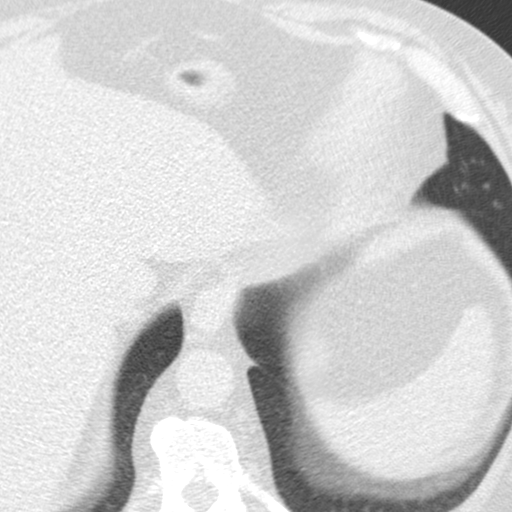
[im 10/87  vessel]
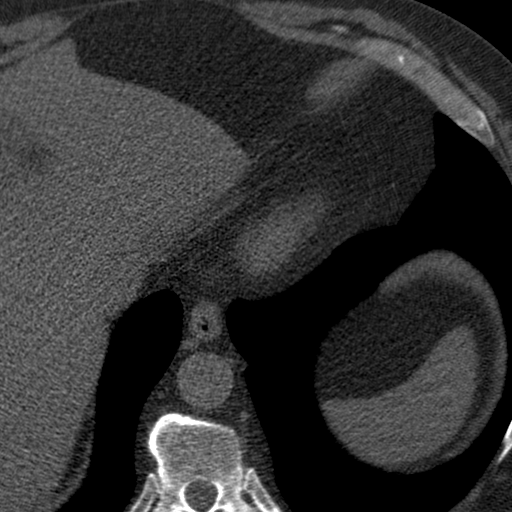
[im 19/87  vessel]
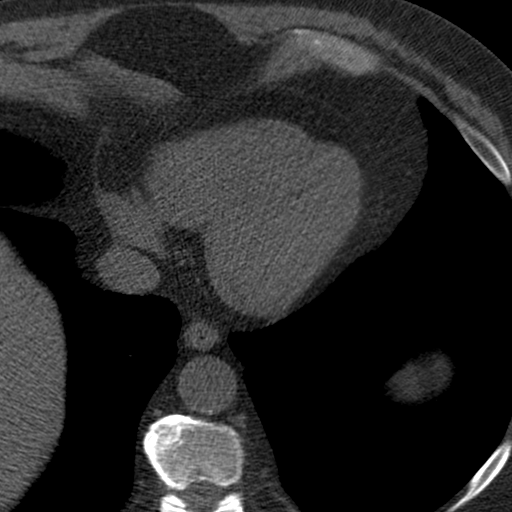
[im 23/87  vessel]
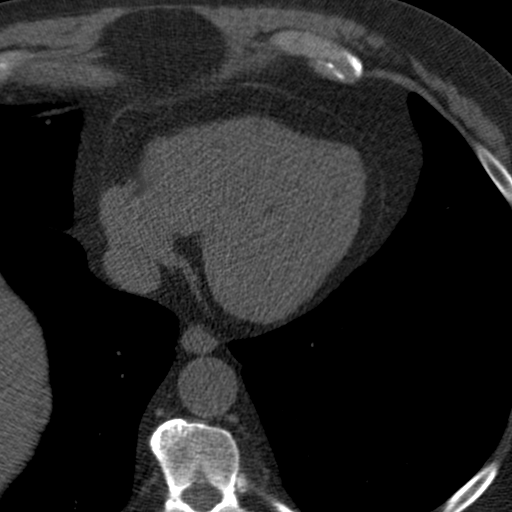
[im 28/87  vessel]
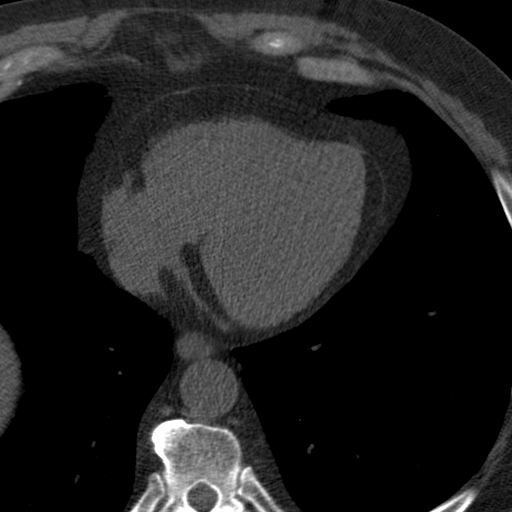
[im 28/87  lung]
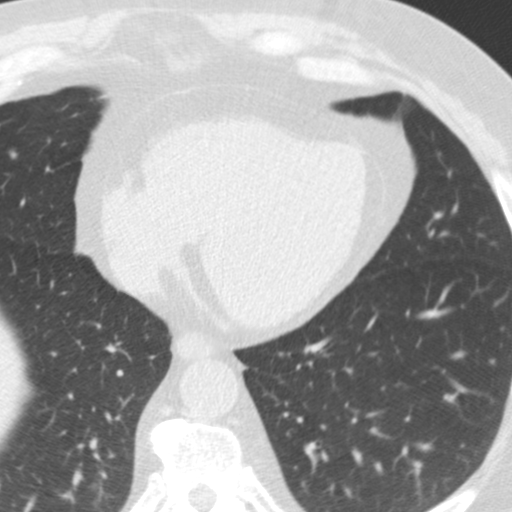
[im 32/87  vessel]
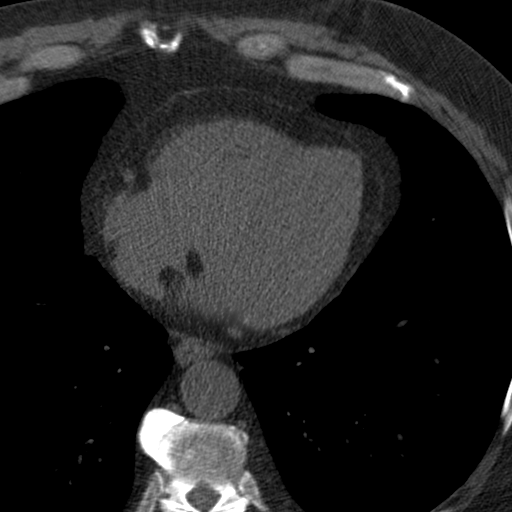
[im 37/87  vessel]
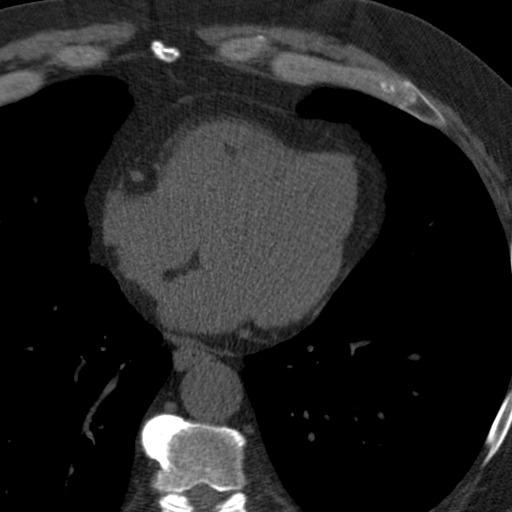
[im 46/87  vessel]
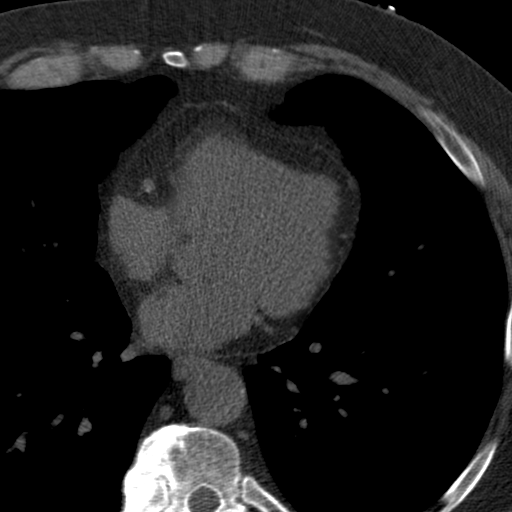
[im 50/87  vessel]
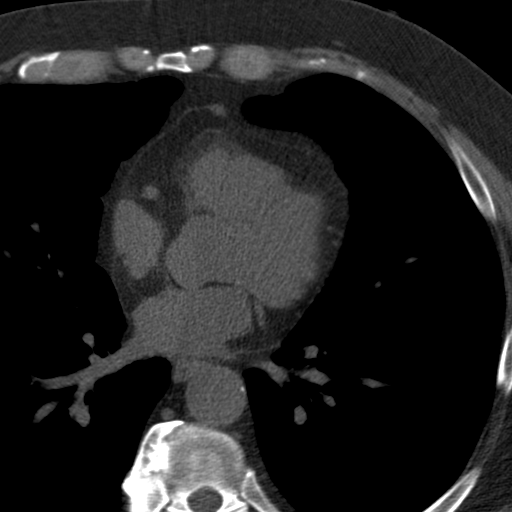
[im 50/87  lung]
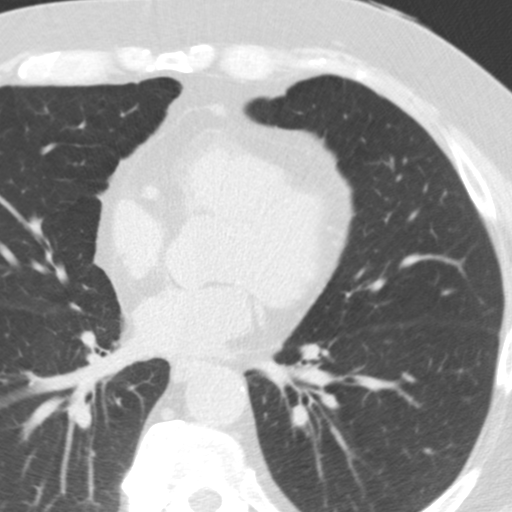
[im 55/87  vessel]
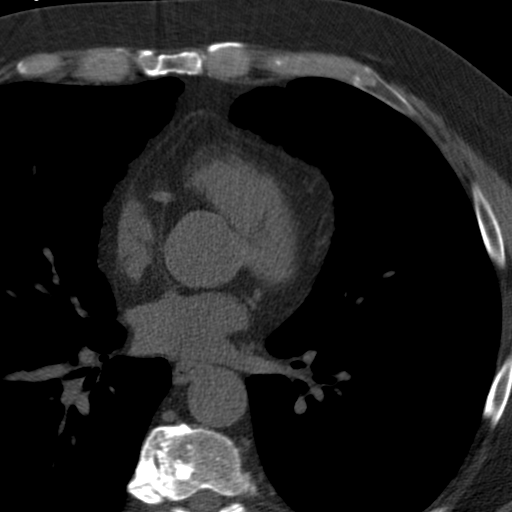
[im 59/87  vessel]
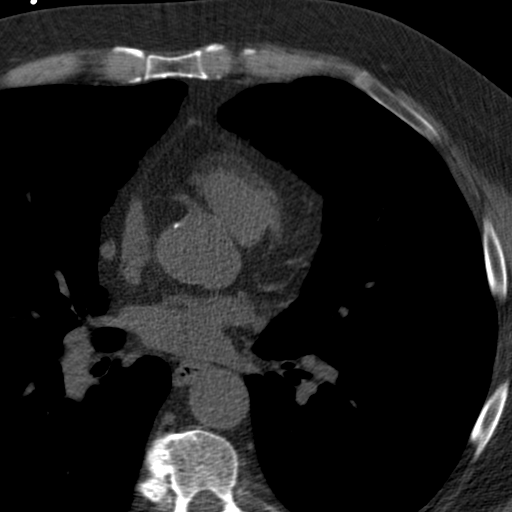
[im 64/87  vessel]
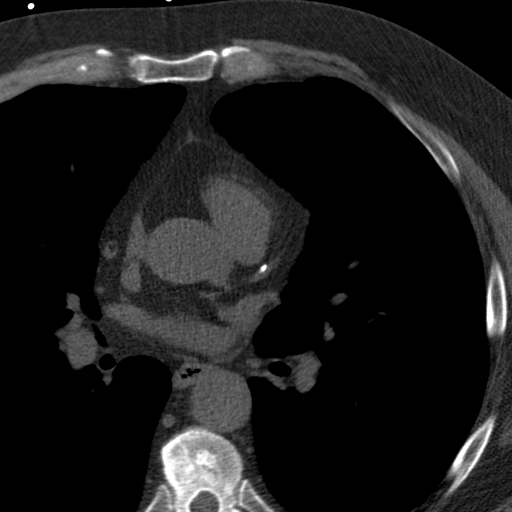
[im 73/87  vessel]
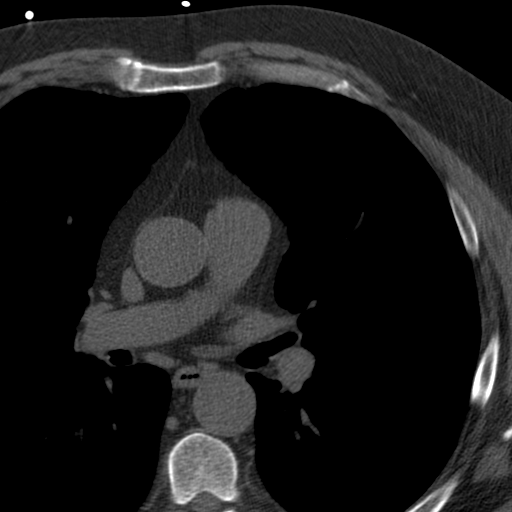
[im 73/87  lung]
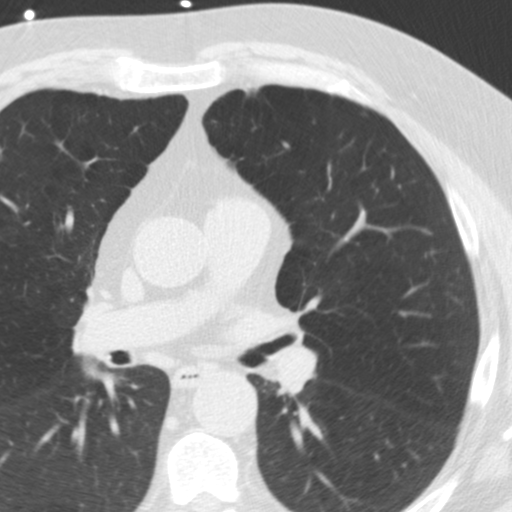
[im 77/87  vessel]
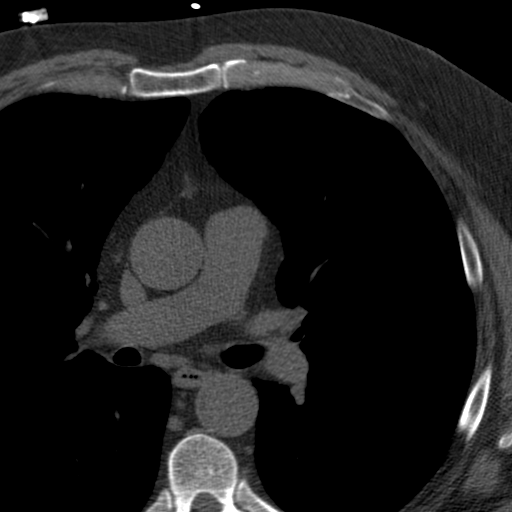
[im 82/87  vessel]
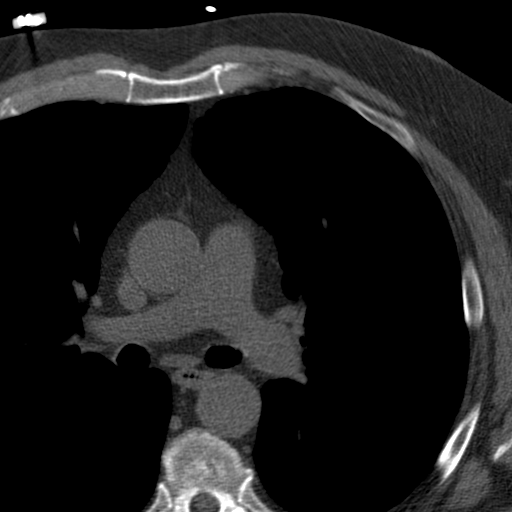

[15 of 20 positions shown; findings below may reference images not displayed]

FINDINGS: Lungs are clear.
IMPRESSION: Total Calcium Score: 88 
No ancillary findings. 
Calcium score                                     Presence of Plague 
0                                                    No evidence of plaque 
1-10                                               Minimal evidence of plaque 
11-100                                            Mild evidence of plaque 
101-400                                          Moderate evidence of plaque 
Over 400                                        Extensive evidence of plaque     

Calcium scoring sheets to follow. 
RADIATION DOSE REDUCTION: All CT scans are performed using radiation dose 
reduction techniques, when applicable.  Technical factors are evaluated and 
adjusted to ensure appropriate moderation of exposure.  Automated dose 
management technology is applied to adjust the radiation doses to minimize 
exposure while achieving diagnostic quality images.

## 2022-04-02 IMAGING — MR MRI RIGHT KNEE WITHOUT CONTRAST
4 of 5 series · 17 of 40 positions shown · IV contrast (gadolinium)
Comparison: 09/29/2019

________________________________________________________________________________________________ 
MRI RIGHT KNEE WITHOUT CONTRAST, 04/02/2022 [DATE]: 
CLINICAL INDICATION: Pain in right knee
TECHNIQUE: Multiplanar, multiecho position MR images of the knee were performed 
without intravenous gadolinium enhancement. Patient was scanned on a
magnet.

[Series 301: PD fat-sat · axial · right · 3.0mm · 0.33mm/px · z∈[-79,+54]mm · 8 of 39 slices shown (1 of 3)]
[im 1/39]
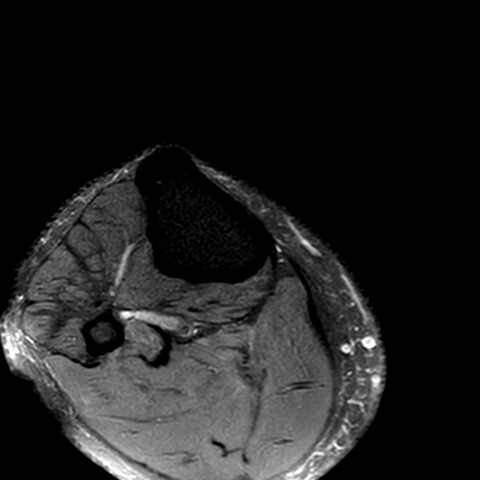
[im 5/39]
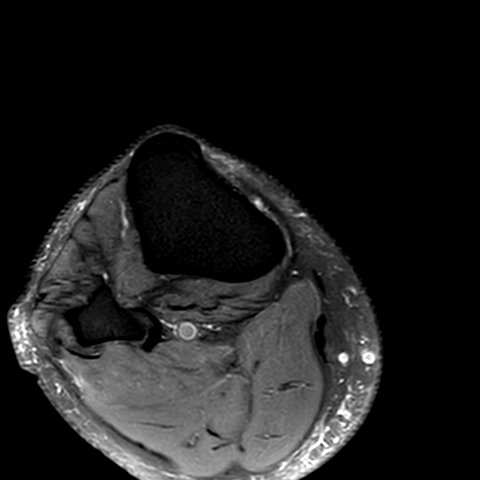
[im 13/39]
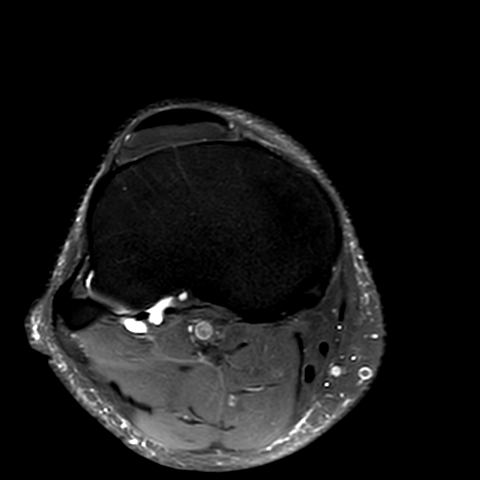
[im 17/39]
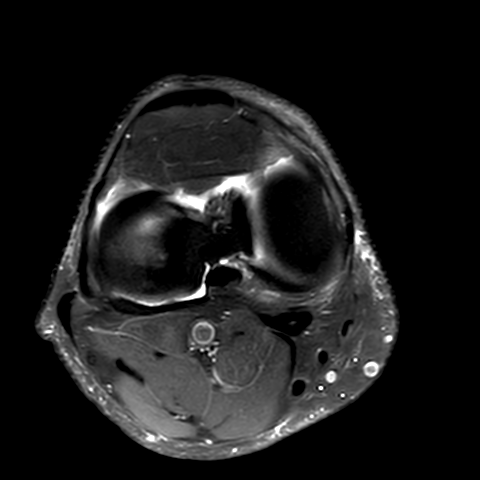
[im 22/39]
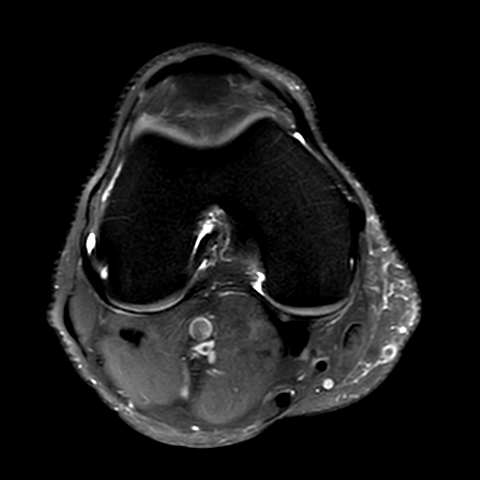
[im 26/39]
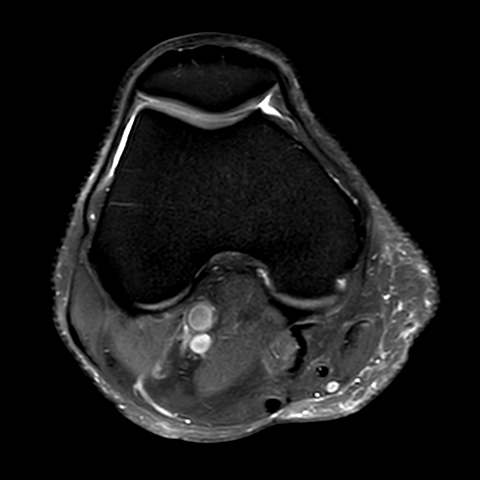
[im 34/39]
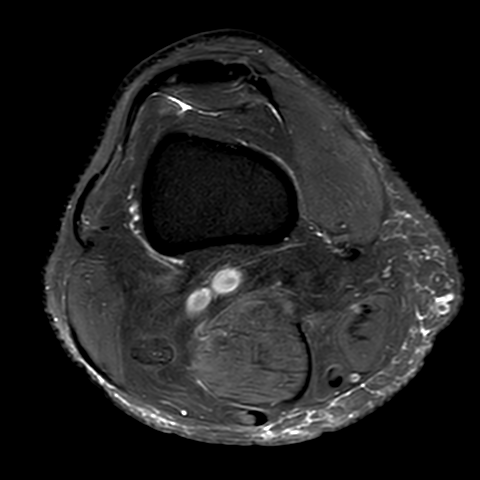
[im 39/39]
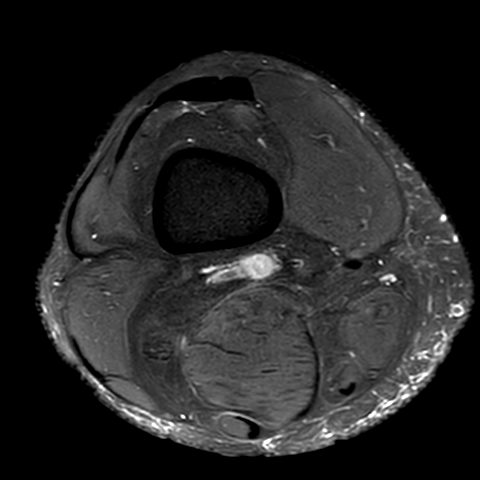

[Series 401: PD fat-sat · sagittal · right · 3.0mm · 0.26mm/px · 3 of 33 slices shown (2 of 3)]
[im 5/33]
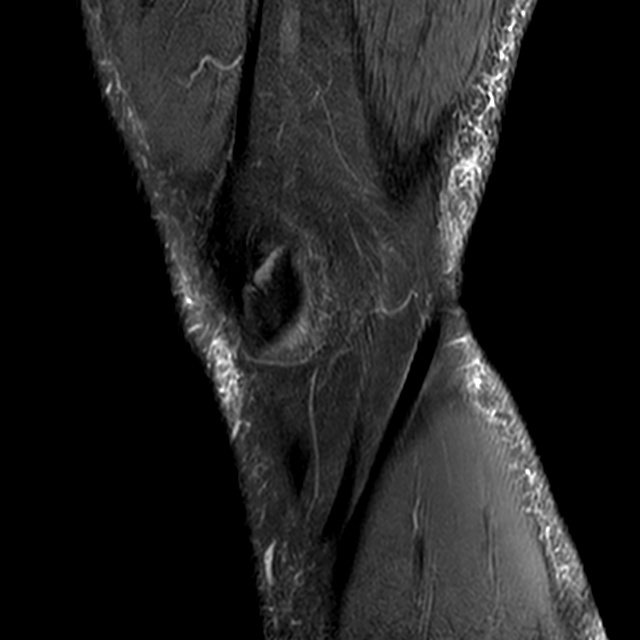
[im 17/33]
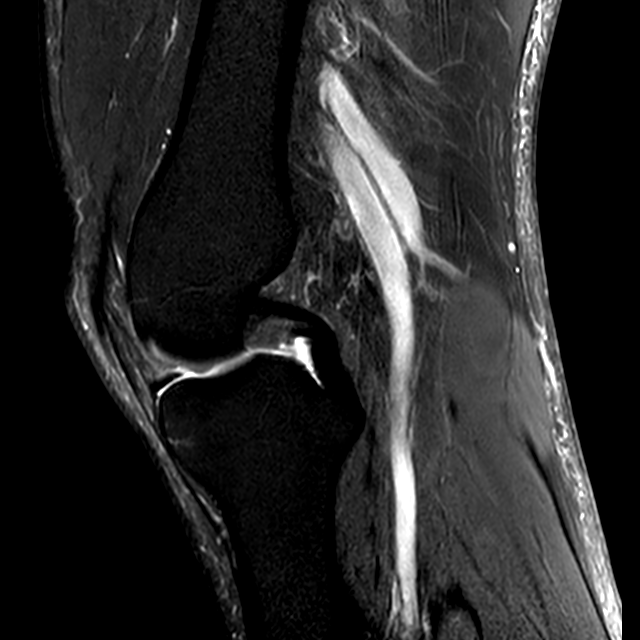
[im 29/33]
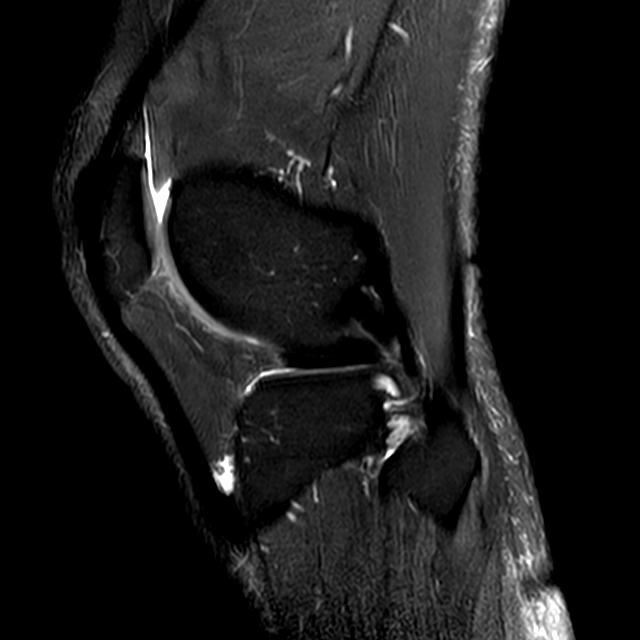

[Series 501: T1 · coronal · right · 3.0mm · 0.30mm/px · 3 of 37 slices shown]
[im 5/37]
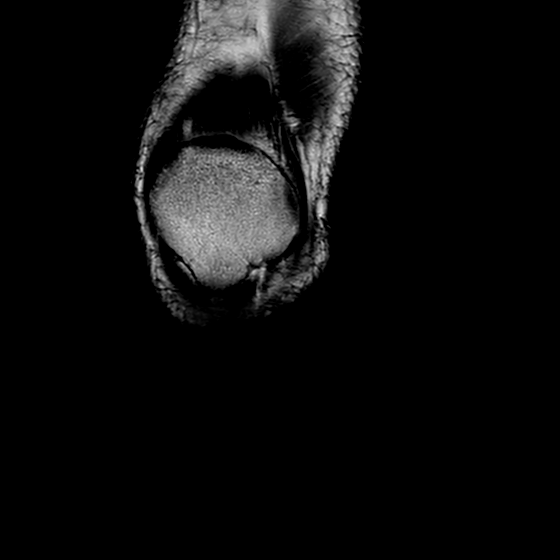
[im 21/37]
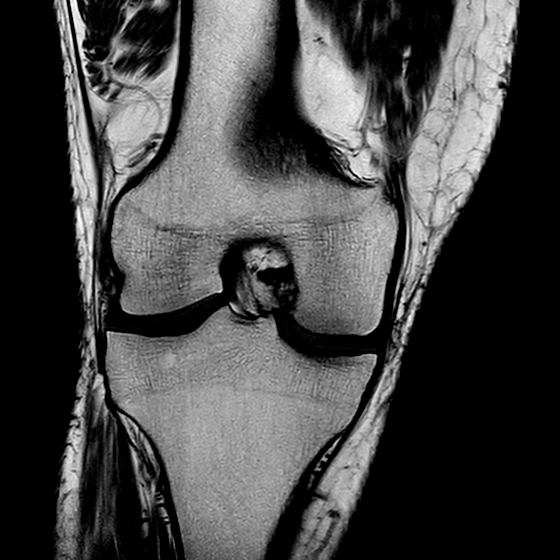
[im 33/37]
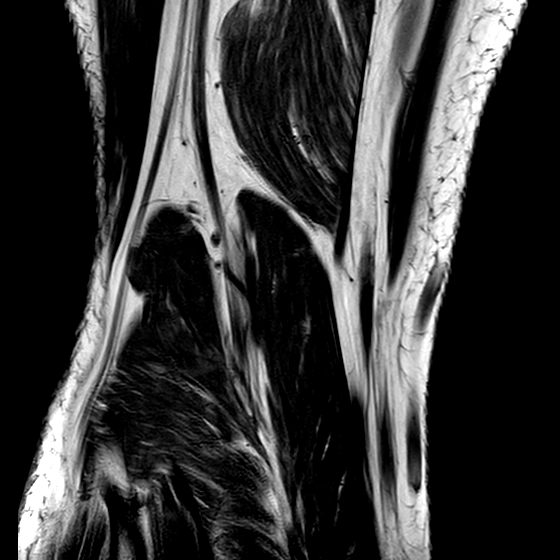

[Series 601: PD fat-sat · coronal · right · 3.0mm · 0.30mm/px · 3 of 37 slices shown (3 of 3)]
[im 5/37]
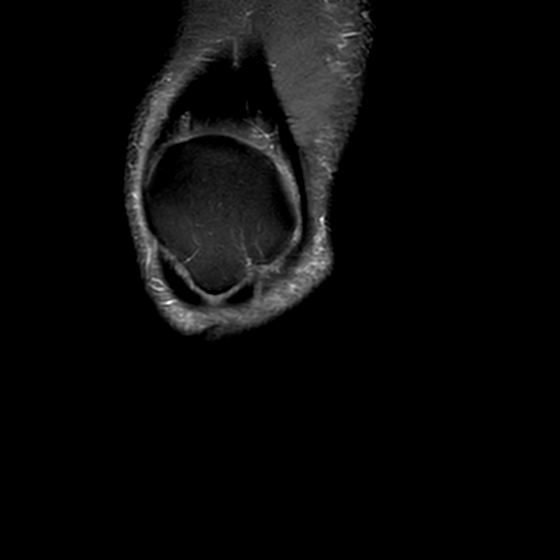
[im 21/37]
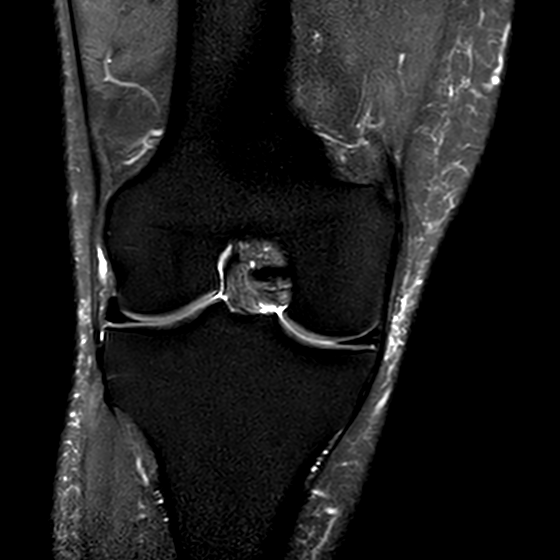
[im 33/37]
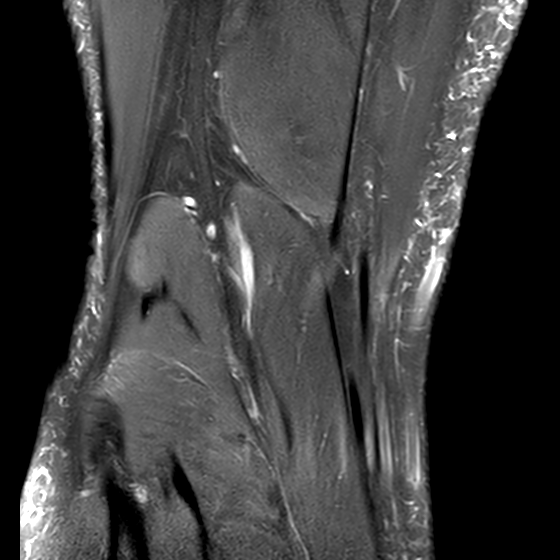

[17 of 40 positions shown; findings below may reference images not displayed]

FINDINGS: MEDIAL COMPARTMENT: Horizontal signal within the posterior horn of the medial 
meniscus with extensive to the inferior articular surface on a single image, 
equivocal for small tear. Mild truncation of the meniscal body. No medial 
meniscal extrusion. Up to grade II chondromalacia.  
LATERAL COMPARTMENT: Mild fraying of the inner margin of the lateral meniscal 
body. 
No meniscal extrusion. Up to grade II chondromalacia. 
PATELLOFEMORAL COMPARTMENT: The patella is centrally located. Up to grade II 
chondromalacia patella.  
TIBIOFIBULAR COMPARTMENT: Negative. 
LIGAMENTS: The anterior cruciate ligament is intact. The posterior cruciate 
ligament is intact. The medial collateral ligament and lateral collateral 
ligaments are preserved. 
EXTENSOR MECHANISM: The quadriceps and patellar tendon are preserved. The medial 
and lateral retinacula are intact. 
POSTEROMEDIAL CORNER: The semimembranosus and pes anserine tendons are 
preserved. The posterior oblique ligament and posterior medial joint capsule are 
intact. 
POSTEROLATERAL CORNER: The popliteal tendon and popliteofibular ligament are 
intact. The biceps femoris is negative. 
BONES: Normal bone marrow signal intensity. No fracture or contusion or stress 
response.  
ADDITIONAL FINDINGS: No knee joint effusion. No popliteal cyst. The musculature 
is normal without mass, signal abnormality or atrophy. Neurovascular bundles are 
negative. Mild subcutaneous soft tissue swelling.
IMPRESSION: 1.  Findings equivocal for small horizontal tear of the posterior horn of the 
medial meniscus and mild truncation of the meniscal body.  
2.  Mild fraying of the inner margin of the lateral meniscal body. 
3.  Mild tricompartmental chondromalacia. 
4.  Mild subcutaneous soft tissue swelling.
